# Patient Record
Sex: Male | Born: 1940 | Race: White | Hispanic: No | Smoking: Former smoker
Health system: Southern US, Community
[De-identification: ages and names within clinical notes are randomized; demographics above are authoritative.]

## PROBLEM LIST (undated history)

## (undated) DIAGNOSIS — I2510 Atherosclerotic heart disease of native coronary artery without angina pectoris: Secondary | ICD-10-CM

## (undated) DIAGNOSIS — E559 Vitamin D deficiency, unspecified: Secondary | ICD-10-CM

## (undated) DIAGNOSIS — F419 Anxiety disorder, unspecified: Secondary | ICD-10-CM

## (undated) DIAGNOSIS — K8020 Calculus of gallbladder without cholecystitis without obstruction: Secondary | ICD-10-CM

## (undated) DIAGNOSIS — D649 Anemia, unspecified: Secondary | ICD-10-CM

## (undated) DIAGNOSIS — G40909 Epilepsy, unspecified, not intractable, without status epilepticus: Secondary | ICD-10-CM

## (undated) DIAGNOSIS — G459 Transient cerebral ischemic attack, unspecified: Secondary | ICD-10-CM

## (undated) DIAGNOSIS — E538 Deficiency of other specified B group vitamins: Secondary | ICD-10-CM

## (undated) DIAGNOSIS — M5090 Cervical disc disorder, unspecified, unspecified cervical region: Secondary | ICD-10-CM

## (undated) DIAGNOSIS — N183 Chronic kidney disease, stage 3 unspecified: Secondary | ICD-10-CM

## (undated) DIAGNOSIS — E039 Hypothyroidism, unspecified: Secondary | ICD-10-CM

## (undated) DIAGNOSIS — I10 Essential (primary) hypertension: Secondary | ICD-10-CM

## (undated) DIAGNOSIS — N400 Enlarged prostate without lower urinary tract symptoms: Secondary | ICD-10-CM

## (undated) HISTORY — DX: Benign prostatic hyperplasia without lower urinary tract symptoms: N40.0

## (undated) HISTORY — DX: Epilepsy, unspecified, not intractable, without status epilepticus: G40.909

## (undated) HISTORY — PX: CHOLECYSTECTOMY: SHX55

## (undated) HISTORY — DX: Calculus of gallbladder without cholecystitis without obstruction: K80.20

## (undated) HISTORY — PX: CYSTOSCOPY WITH INSERTION OF UROLIFT: SHX6678

## (undated) HISTORY — DX: Deficiency of other specified B group vitamins: E53.8

## (undated) HISTORY — PX: BLADDER SURGERY: SHX569

## (undated) HISTORY — DX: Chronic kidney disease, stage 3 unspecified: N18.30

## (undated) HISTORY — PX: CATARACT EXTRACTION: SUR2

## (undated) HISTORY — DX: Atherosclerotic heart disease of native coronary artery without angina pectoris: I25.10

## (undated) HISTORY — DX: Essential (primary) hypertension: I10

## (undated) HISTORY — DX: Hypothyroidism, unspecified: E03.9

## (undated) HISTORY — DX: Vitamin D deficiency, unspecified: E55.9

## (undated) HISTORY — PX: NECK SURGERY: SHX720

## (undated) HISTORY — PX: OTHER SURGICAL HISTORY: SHX169

## (undated) HISTORY — DX: Transient cerebral ischemic attack, unspecified: G45.9

## (undated) HISTORY — PX: HERNIA REPAIR: SHX51

## (undated) HISTORY — DX: Anxiety disorder, unspecified: F41.9

## (undated) HISTORY — DX: Anemia, unspecified: D64.9

## (undated) HISTORY — DX: Cervical disc disorder, unspecified, unspecified cervical region: M50.90

## (undated) HISTORY — PX: TRANSURETHRAL RESECTION OF PROSTATE: SHX73

---

## 2007-08-23 ENCOUNTER — Inpatient Hospital Stay (HOSPITAL_COMMUNITY): Admission: RE | Admit: 2007-08-23 | Discharge: 2007-08-26 | Payer: Self-pay | Admitting: Neurosurgery

## 2010-07-19 NOTE — Op Note (Signed)
[name], [name]             ACCOUNT NO.:  [id number]   MEDICAL RECORD NO.:  [id number]          PATIENT TYPE:  INP   LOCATION:  3102                         FACILITY:  [facility]   PHYSICIAN:  Danae Orleans. Venetia Maxon, M.D.  DATE OF BIRTH:  1940-06-15   DATE OF PROCEDURE:  08/23/2007  DATE OF DISCHARGE:                               OPERATIVE REPORT   PREOPERATIVE DIAGNOSES:  Herniated cervical disk with spondylosis,  myelopathy, cervical stenosis, cervical radiculopathy at C3-4, C4-5, C5-  6, and C6-7 levels.   POSTOPERATIVE DIAGNOSES:  Herniated cervical disk with spondylosis,  myelopathy, cervical stenosis, cervical radiculopathy at C3-4, C4-5, C5-  6, and C6-7 levels.   PROCEDURES:  Anterior cervical decompression and fusion at C3-4, C4-5,  C5-6, and C6-7 levels with allograft bone wedge, morselized bone  autograft, FortrOss and anterior cervical plate.   SURGEON:  Danae Orleans. Venetia Maxon, MD   ASSISTANTS:  1. Georgiann Cocker, RN.  2. Stefani Dama, MD.   ANESTHESIA:  General endotracheal anesthesia.   ESTIMATED BLOOD LOSS:  100 mL.   COMPLICATIONS:  None.   DISPOSITION:  Recovery.   INDICATIONS:  Joshua Parker is a 70 year old man with severe cervical  spondylotic myelopathy, cord compression, and increased cord signal with  bilateral upper and lower extremity numbness and weakness, and cervical  myelopathy along with cervical radiculopathy.  It was elected to take  him to surgery for anterior cervical decompression and fusion at C3  through C7 levels.   PROCEDURE IN DETAIL:  Joshua Parker was brought to the operating room.  Following satisfactory and uncomplicated induction of general  endotracheal anesthesia and placement of intravenous lines, the patient  was placed in supine position on the operating table.  The neck was  placed in slight extension.  His neck was maintained in neutral  alignment.  His anterior neck was then prepped and draped in the usual  sterile fashion.   Area of planned incision was infiltrated with 0.25%  Marcaine, 0.5% lidocaine with 1:100,000 epinephrine.  Incision was made  in the midline to the anterior border of sternocleidomastoid muscle  carried sharply to the platysma layer.  Subplatysmal dissection was  performed exposing the anterior border of the sternocleidomastoid muscle  using blunt dissection.  The carotid sheath was kept lateral and the  trachea and esophagus was kept medial exposing the anterior cervical  spine.  A bent spinal needle was placed.  It was placed in one of the  anterior disk space and this was confirmed to be C5-C6 levels.  Subsequently, exposure was performed to C3 through C7 levels using the  electrocautery, key elevator, and mobilizing the longus colli muscles.  A self-retaining Shadow-Line retractor was placed to facilitate exposure  initially using the C3-C4 and C4-5 levels.  The operative microscope was  brought in field.  Distraction pins were placed at C3 and C4 and disk  spaces thoroughly cleared of disk material.  The high-speed drill was  used and suction trap was used to preserve the bone removed with  drilling of the endplate.  The uncinate spurs were drilled down with a  smaller  match stick bur and again bone autograft was saved for latter  use with bone grafting.  Using the 1, 2, and 3-mm gold-tipped Kerrison  rongeurs, the spinal cord dura was decompression as were both neural  foramina.  A 6-mm interbody graft was selected and packed with  morselized fashion with a high-speed drill pack, morselized and  autograft inserted in the interspace and countersunk appropriately.  Attention was then turned to the C4-5 level.  A similar decompression  was performed.  Again, the C5 nerve roots are decompressed at the  central spinal cord.  A large spondylotic bar was removed.  These have  all been decompressed on the spinal cord dura.  After trial sizing it  similarly, size graft was selected and the  fashion with high-speed drill  and packed with morselized bone autograft.  FortrOss inserted in the  interspace and countersunk appropriately and C6-7 level was then  addressed, and at this level, the endplates were stripped off disk  material.  Uncinate spurs were drilled down.  A large central disk  herniation was removed with significant decompression of the central  spinal cord dura, after trial sizing, an 8-mm graft was selected,  fashioned with high-speed drill, packed with morcellized bone autograft  and FortrOss inserted in the interspace and countersunk appropriately.  Then C5-C6 level decompression was again performed and 6-mm graft was  inserted.  A 72-mm Trestle anterior cervical plate was then affixed to  the anterior cervical spine using variable angle 14-mm screws two at C3,  two at C4, two at C5, two at C6, and two at C7.  All screws had  excellent purchase.  Positioning was confirmed on lateral x-ray.  The  plate had been bent.  The inner lordosis of the plate was increased  somewhat with plate bent or prior to placing the plate.  Hemostasis was  then reassured.  Soft tissues were inspected and found to be in good  repair.  A #7 JP drain was inserted through separate stab incision and  anchored with a single nylon stitch.  The platysmal layer was closed  with 3-0 Vicryl sutures.  Skin edges were reapproximated with 3-0 Vicryl  sutures, and the wound was dressed with benzoin, Steri-Strips, Telfa  gauze, and tape.  The patient was extubated in the operating room and  taken to the recovery in stable and satisfactory condition having  tolerated the operation well.  Counts were correct at the end of the  case.      Danae Orleans. Venetia Maxon, M.D.  Electronically Signed     JDS/MEDQ  D:  08/23/2007  T:  08/24/2007  Job:  [id number]

## 2010-07-22 NOTE — Discharge Summary (Signed)
Joshua Parker, Joshua Parker             ACCOUNT NO.:  [id number]   MEDICAL RECORD NO.:  [id number]          PATIENT TYPE:  INP   LOCATION:  3102                         FACILITY:  [facility]   PHYSICIAN:  Danae Orleans. Venetia Maxon, M.D.  DATE OF BIRTH:  Jan 28, 1941   DATE OF ADMISSION:  08/23/2007  DATE OF DISCHARGE:  08/26/2007                               DISCHARGE SUMMARY   REASON FOR ADMISSION:  1. Cervical spondylosis with myelopathy.  2. Cervical disk herniation with myelopathy.  3. Hypothyroidism.  4. History of tobacco use.  5. Epilepsy, unspecified.   FINAL DIAGNOSES:  1. Cervical spondylosis with myelopathy.  2. Cervical disk herniation with myelopathy.  3. Hypothyroidism.  4. History of tobacco use.  5. Epilepsy, unspecified.   HISTORY OF ILLNESS AND HOSPITAL COURSE:  Joshua Parker is a 70-year-  old man with herniated cervical disk with cervical stenosis, cervical  radiculopathy, and myelopathy with over 4 levels.  He was admitted to  the hospital and underwent uncomplicated anterior cervical decompression  and fusion, C3-C7 levels.  Postoperatively, he did have some confusion  which gradually resolved.  He was able to walk with a walker.  He was  observed in the hospital.  He had a drain which was discontinued on August 25, 2007 and was doing well on the August 26, 2007.  He was discharged  home in stable and satisfactory condition having tolerated his operation  and hospitalization well.   DISCHARGE MEDICATIONS:  1. Percocet.  2. Flexeril.   He was instructed to follow up in the office in 3 weeks postoperatively.  He was discharged home with a collar.      Danae Orleans. Venetia Maxon, M.D.  Electronically Signed     JDS/MEDQ  D:  10/16/2007  T:  10/16/2007  Job:  [id number]

## 2010-12-01 LAB — BASIC METABOLIC PANEL
CO2: 27
Chloride: 106
GFR calc non Af Amer: >60
Glucose, Bld: 108 — ABNORMAL HIGH
Potassium: 3.9
Sodium: 139

## 2010-12-01 LAB — CBC
HCT: 36.6 — ABNORMAL LOW
Hemoglobin: 12.7 — ABNORMAL LOW
MCHC: 34.6
MCV: 88.9
RDW: 13.1

## 2011-05-20 DIAGNOSIS — R569 Unspecified convulsions: Secondary | ICD-10-CM | POA: Diagnosis not present

## 2011-05-20 DIAGNOSIS — R32 Unspecified urinary incontinence: Secondary | ICD-10-CM | POA: Diagnosis not present

## 2011-05-20 DIAGNOSIS — I6350 Cerebral infarction due to unspecified occlusion or stenosis of unspecified cerebral artery: Secondary | ICD-10-CM | POA: Diagnosis not present

## 2011-05-20 DIAGNOSIS — R4182 Altered mental status, unspecified: Secondary | ICD-10-CM | POA: Diagnosis not present

## 2011-05-20 DIAGNOSIS — R404 Transient alteration of awareness: Secondary | ICD-10-CM | POA: Diagnosis not present

## 2011-05-20 DIAGNOSIS — E039 Hypothyroidism, unspecified: Secondary | ICD-10-CM | POA: Diagnosis not present

## 2011-05-20 DIAGNOSIS — G9389 Other specified disorders of brain: Secondary | ICD-10-CM | POA: Diagnosis not present

## 2011-05-20 DIAGNOSIS — G40909 Epilepsy, unspecified, not intractable, without status epilepticus: Secondary | ICD-10-CM | POA: Diagnosis not present

## 2011-05-20 DIAGNOSIS — R51 Headache: Secondary | ICD-10-CM | POA: Diagnosis not present

## 2011-05-20 DIAGNOSIS — Z79899 Other long term (current) drug therapy: Secondary | ICD-10-CM | POA: Diagnosis not present

## 2011-05-20 DIAGNOSIS — G40802 Other epilepsy, not intractable, without status epilepticus: Secondary | ICD-10-CM | POA: Diagnosis not present

## 2011-05-20 DIAGNOSIS — Z87820 Personal history of traumatic brain injury: Secondary | ICD-10-CM | POA: Diagnosis not present

## 2011-05-21 DIAGNOSIS — E039 Hypothyroidism, unspecified: Secondary | ICD-10-CM | POA: Diagnosis not present

## 2011-05-21 DIAGNOSIS — R569 Unspecified convulsions: Secondary | ICD-10-CM | POA: Diagnosis not present

## 2011-05-30 DIAGNOSIS — Z79899 Other long term (current) drug therapy: Secondary | ICD-10-CM | POA: Diagnosis not present

## 2011-05-30 DIAGNOSIS — F329 Major depressive disorder, single episode, unspecified: Secondary | ICD-10-CM | POA: Diagnosis not present

## 2011-05-30 DIAGNOSIS — G40802 Other epilepsy, not intractable, without status epilepticus: Secondary | ICD-10-CM | POA: Diagnosis not present

## 2011-05-30 DIAGNOSIS — F3289 Other specified depressive episodes: Secondary | ICD-10-CM | POA: Diagnosis not present

## 2011-05-30 DIAGNOSIS — E039 Hypothyroidism, unspecified: Secondary | ICD-10-CM | POA: Diagnosis not present

## 2011-06-12 DIAGNOSIS — G43009 Migraine without aura, not intractable, without status migrainosus: Secondary | ICD-10-CM | POA: Diagnosis not present

## 2011-06-12 DIAGNOSIS — G40909 Epilepsy, unspecified, not intractable, without status epilepticus: Secondary | ICD-10-CM | POA: Diagnosis not present

## 2011-06-12 DIAGNOSIS — R413 Other amnesia: Secondary | ICD-10-CM | POA: Diagnosis not present

## 2011-06-27 DIAGNOSIS — Z79899 Other long term (current) drug therapy: Secondary | ICD-10-CM | POA: Diagnosis not present

## 2011-06-27 DIAGNOSIS — G40802 Other epilepsy, not intractable, without status epilepticus: Secondary | ICD-10-CM | POA: Diagnosis not present

## 2011-06-27 DIAGNOSIS — E039 Hypothyroidism, unspecified: Secondary | ICD-10-CM | POA: Diagnosis not present

## 2011-07-24 DIAGNOSIS — Z79899 Other long term (current) drug therapy: Secondary | ICD-10-CM | POA: Diagnosis not present

## 2011-07-24 DIAGNOSIS — E039 Hypothyroidism, unspecified: Secondary | ICD-10-CM | POA: Diagnosis not present

## 2011-08-28 DIAGNOSIS — B372 Candidiasis of skin and nail: Secondary | ICD-10-CM | POA: Diagnosis not present

## 2011-09-26 DIAGNOSIS — M62838 Other muscle spasm: Secondary | ICD-10-CM | POA: Diagnosis not present

## 2011-09-26 DIAGNOSIS — E538 Deficiency of other specified B group vitamins: Secondary | ICD-10-CM | POA: Diagnosis not present

## 2011-09-26 DIAGNOSIS — Z79899 Other long term (current) drug therapy: Secondary | ICD-10-CM | POA: Diagnosis not present

## 2011-09-26 DIAGNOSIS — R21 Rash and other nonspecific skin eruption: Secondary | ICD-10-CM | POA: Diagnosis not present

## 2011-09-26 DIAGNOSIS — E039 Hypothyroidism, unspecified: Secondary | ICD-10-CM | POA: Diagnosis not present

## 2011-09-26 DIAGNOSIS — G40802 Other epilepsy, not intractable, without status epilepticus: Secondary | ICD-10-CM | POA: Diagnosis not present

## 2011-09-26 DIAGNOSIS — E559 Vitamin D deficiency, unspecified: Secondary | ICD-10-CM | POA: Diagnosis not present

## 2011-10-17 DIAGNOSIS — Z79899 Other long term (current) drug therapy: Secondary | ICD-10-CM | POA: Diagnosis not present

## 2011-10-31 DIAGNOSIS — Z79899 Other long term (current) drug therapy: Secondary | ICD-10-CM | POA: Diagnosis not present

## 2011-11-22 DIAGNOSIS — E039 Hypothyroidism, unspecified: Secondary | ICD-10-CM | POA: Diagnosis not present

## 2011-11-22 DIAGNOSIS — Z79899 Other long term (current) drug therapy: Secondary | ICD-10-CM | POA: Diagnosis not present

## 2011-12-27 DIAGNOSIS — E559 Vitamin D deficiency, unspecified: Secondary | ICD-10-CM | POA: Diagnosis not present

## 2011-12-27 DIAGNOSIS — G40802 Other epilepsy, not intractable, without status epilepticus: Secondary | ICD-10-CM | POA: Diagnosis not present

## 2011-12-27 DIAGNOSIS — E039 Hypothyroidism, unspecified: Secondary | ICD-10-CM | POA: Diagnosis not present

## 2011-12-27 DIAGNOSIS — E538 Deficiency of other specified B group vitamins: Secondary | ICD-10-CM | POA: Diagnosis not present

## 2011-12-27 DIAGNOSIS — Z79899 Other long term (current) drug therapy: Secondary | ICD-10-CM | POA: Diagnosis not present

## 2011-12-27 DIAGNOSIS — D539 Nutritional anemia, unspecified: Secondary | ICD-10-CM | POA: Diagnosis not present

## 2011-12-27 DIAGNOSIS — M5030 Other cervical disc degeneration, unspecified cervical region: Secondary | ICD-10-CM | POA: Diagnosis not present

## 2012-02-05 DIAGNOSIS — Z79899 Other long term (current) drug therapy: Secondary | ICD-10-CM | POA: Diagnosis not present

## 2012-03-27 DIAGNOSIS — M539 Dorsopathy, unspecified: Secondary | ICD-10-CM | POA: Diagnosis not present

## 2012-03-27 DIAGNOSIS — Z79899 Other long term (current) drug therapy: Secondary | ICD-10-CM | POA: Diagnosis not present

## 2012-03-27 DIAGNOSIS — D649 Anemia, unspecified: Secondary | ICD-10-CM | POA: Diagnosis not present

## 2012-03-27 DIAGNOSIS — E559 Vitamin D deficiency, unspecified: Secondary | ICD-10-CM | POA: Diagnosis not present

## 2012-03-27 DIAGNOSIS — M999 Biomechanical lesion, unspecified: Secondary | ICD-10-CM | POA: Diagnosis not present

## 2012-03-27 DIAGNOSIS — G40802 Other epilepsy, not intractable, without status epilepticus: Secondary | ICD-10-CM | POA: Diagnosis not present

## 2012-03-27 DIAGNOSIS — M25659 Stiffness of unspecified hip, not elsewhere classified: Secondary | ICD-10-CM | POA: Diagnosis not present

## 2012-03-27 DIAGNOSIS — E039 Hypothyroidism, unspecified: Secondary | ICD-10-CM | POA: Diagnosis not present

## 2012-03-27 DIAGNOSIS — M5030 Other cervical disc degeneration, unspecified cervical region: Secondary | ICD-10-CM | POA: Diagnosis not present

## 2012-03-27 DIAGNOSIS — M5380 Other specified dorsopathies, site unspecified: Secondary | ICD-10-CM | POA: Diagnosis not present

## 2012-03-27 DIAGNOSIS — R7989 Other specified abnormal findings of blood chemistry: Secondary | ICD-10-CM | POA: Diagnosis not present

## 2012-03-27 DIAGNOSIS — R51 Headache: Secondary | ICD-10-CM | POA: Diagnosis not present

## 2012-03-29 DIAGNOSIS — M5380 Other specified dorsopathies, site unspecified: Secondary | ICD-10-CM | POA: Diagnosis not present

## 2012-03-29 DIAGNOSIS — M25659 Stiffness of unspecified hip, not elsewhere classified: Secondary | ICD-10-CM | POA: Diagnosis not present

## 2012-03-29 DIAGNOSIS — M999 Biomechanical lesion, unspecified: Secondary | ICD-10-CM | POA: Diagnosis not present

## 2012-04-01 DIAGNOSIS — M5380 Other specified dorsopathies, site unspecified: Secondary | ICD-10-CM | POA: Diagnosis not present

## 2012-04-01 DIAGNOSIS — M999 Biomechanical lesion, unspecified: Secondary | ICD-10-CM | POA: Diagnosis not present

## 2012-04-01 DIAGNOSIS — M25659 Stiffness of unspecified hip, not elsewhere classified: Secondary | ICD-10-CM | POA: Diagnosis not present

## 2012-05-22 DIAGNOSIS — R51 Headache: Secondary | ICD-10-CM | POA: Diagnosis not present

## 2012-07-30 DIAGNOSIS — Z79899 Other long term (current) drug therapy: Secondary | ICD-10-CM | POA: Diagnosis not present

## 2012-07-30 DIAGNOSIS — R7989 Other specified abnormal findings of blood chemistry: Secondary | ICD-10-CM | POA: Diagnosis not present

## 2012-07-30 DIAGNOSIS — E039 Hypothyroidism, unspecified: Secondary | ICD-10-CM | POA: Diagnosis not present

## 2012-08-13 DIAGNOSIS — G40802 Other epilepsy, not intractable, without status epilepticus: Secondary | ICD-10-CM | POA: Diagnosis not present

## 2012-08-13 DIAGNOSIS — R945 Abnormal results of liver function studies: Secondary | ICD-10-CM | POA: Diagnosis not present

## 2012-08-13 DIAGNOSIS — R51 Headache: Secondary | ICD-10-CM | POA: Diagnosis not present

## 2012-08-13 DIAGNOSIS — E039 Hypothyroidism, unspecified: Secondary | ICD-10-CM | POA: Diagnosis not present

## 2012-09-04 DIAGNOSIS — R945 Abnormal results of liver function studies: Secondary | ICD-10-CM | POA: Diagnosis not present

## 2012-09-04 DIAGNOSIS — K8020 Calculus of gallbladder without cholecystitis without obstruction: Secondary | ICD-10-CM | POA: Diagnosis not present

## 2012-09-26 DIAGNOSIS — Z79899 Other long term (current) drug therapy: Secondary | ICD-10-CM | POA: Diagnosis not present

## 2012-09-26 DIAGNOSIS — E039 Hypothyroidism, unspecified: Secondary | ICD-10-CM | POA: Diagnosis not present

## 2012-10-30 DIAGNOSIS — F411 Generalized anxiety disorder: Secondary | ICD-10-CM | POA: Diagnosis not present

## 2012-10-30 DIAGNOSIS — G40802 Other epilepsy, not intractable, without status epilepticus: Secondary | ICD-10-CM | POA: Diagnosis not present

## 2012-10-30 DIAGNOSIS — Z79899 Other long term (current) drug therapy: Secondary | ICD-10-CM | POA: Diagnosis not present

## 2012-10-30 DIAGNOSIS — R51 Headache: Secondary | ICD-10-CM | POA: Diagnosis not present

## 2012-12-05 DIAGNOSIS — R269 Unspecified abnormalities of gait and mobility: Secondary | ICD-10-CM | POA: Diagnosis not present

## 2012-12-05 DIAGNOSIS — E538 Deficiency of other specified B group vitamins: Secondary | ICD-10-CM | POA: Diagnosis not present

## 2012-12-05 DIAGNOSIS — E559 Vitamin D deficiency, unspecified: Secondary | ICD-10-CM | POA: Diagnosis not present

## 2012-12-05 DIAGNOSIS — Z79899 Other long term (current) drug therapy: Secondary | ICD-10-CM | POA: Diagnosis not present

## 2012-12-05 DIAGNOSIS — M5030 Other cervical disc degeneration, unspecified cervical region: Secondary | ICD-10-CM | POA: Diagnosis not present

## 2012-12-05 DIAGNOSIS — E039 Hypothyroidism, unspecified: Secondary | ICD-10-CM | POA: Diagnosis not present

## 2012-12-05 DIAGNOSIS — R51 Headache: Secondary | ICD-10-CM | POA: Diagnosis not present

## 2012-12-14 DIAGNOSIS — H353190 Nonexudative age-related macular degeneration, unspecified eye, stage unspecified: Secondary | ICD-10-CM | POA: Diagnosis not present

## 2012-12-14 DIAGNOSIS — H2510 Age-related nuclear cataract, unspecified eye: Secondary | ICD-10-CM | POA: Diagnosis not present

## 2013-01-20 DIAGNOSIS — R21 Rash and other nonspecific skin eruption: Secondary | ICD-10-CM | POA: Diagnosis not present

## 2013-01-20 DIAGNOSIS — G40802 Other epilepsy, not intractable, without status epilepticus: Secondary | ICD-10-CM | POA: Diagnosis not present

## 2013-01-20 DIAGNOSIS — R51 Headache: Secondary | ICD-10-CM | POA: Diagnosis not present

## 2013-02-20 DIAGNOSIS — E039 Hypothyroidism, unspecified: Secondary | ICD-10-CM | POA: Diagnosis not present

## 2013-02-20 DIAGNOSIS — Z79899 Other long term (current) drug therapy: Secondary | ICD-10-CM | POA: Diagnosis not present

## 2013-02-21 DIAGNOSIS — H2510 Age-related nuclear cataract, unspecified eye: Secondary | ICD-10-CM | POA: Diagnosis not present

## 2013-02-21 DIAGNOSIS — H353190 Nonexudative age-related macular degeneration, unspecified eye, stage unspecified: Secondary | ICD-10-CM | POA: Diagnosis not present

## 2013-04-15 DIAGNOSIS — E559 Vitamin D deficiency, unspecified: Secondary | ICD-10-CM | POA: Diagnosis not present

## 2013-04-15 DIAGNOSIS — E039 Hypothyroidism, unspecified: Secondary | ICD-10-CM | POA: Diagnosis not present

## 2013-04-15 DIAGNOSIS — Z79899 Other long term (current) drug therapy: Secondary | ICD-10-CM | POA: Diagnosis not present

## 2013-04-15 DIAGNOSIS — G40802 Other epilepsy, not intractable, without status epilepticus: Secondary | ICD-10-CM | POA: Diagnosis not present

## 2013-04-15 DIAGNOSIS — R51 Headache: Secondary | ICD-10-CM | POA: Diagnosis not present

## 2013-07-10 DIAGNOSIS — Z79899 Other long term (current) drug therapy: Secondary | ICD-10-CM | POA: Diagnosis not present

## 2013-07-10 DIAGNOSIS — G40802 Other epilepsy, not intractable, without status epilepticus: Secondary | ICD-10-CM | POA: Diagnosis not present

## 2013-07-10 DIAGNOSIS — R21 Rash and other nonspecific skin eruption: Secondary | ICD-10-CM | POA: Diagnosis not present

## 2013-07-10 DIAGNOSIS — E039 Hypothyroidism, unspecified: Secondary | ICD-10-CM | POA: Diagnosis not present

## 2013-08-18 DIAGNOSIS — E039 Hypothyroidism, unspecified: Secondary | ICD-10-CM | POA: Diagnosis not present

## 2013-08-18 DIAGNOSIS — G40802 Other epilepsy, not intractable, without status epilepticus: Secondary | ICD-10-CM | POA: Diagnosis not present

## 2013-08-18 DIAGNOSIS — T50995A Adverse effect of other drugs, medicaments and biological substances, initial encounter: Secondary | ICD-10-CM | POA: Diagnosis not present

## 2013-08-18 DIAGNOSIS — Z79899 Other long term (current) drug therapy: Secondary | ICD-10-CM | POA: Diagnosis not present

## 2013-08-18 DIAGNOSIS — M5030 Other cervical disc degeneration, unspecified cervical region: Secondary | ICD-10-CM | POA: Diagnosis not present

## 2013-11-18 DIAGNOSIS — E039 Hypothyroidism, unspecified: Secondary | ICD-10-CM | POA: Diagnosis not present

## 2013-11-18 DIAGNOSIS — H6980 Other specified disorders of Eustachian tube, unspecified ear: Secondary | ICD-10-CM | POA: Diagnosis not present

## 2013-11-18 DIAGNOSIS — G40802 Other epilepsy, not intractable, without status epilepticus: Secondary | ICD-10-CM | POA: Diagnosis not present

## 2013-11-18 DIAGNOSIS — Z79899 Other long term (current) drug therapy: Secondary | ICD-10-CM | POA: Diagnosis not present

## 2013-11-18 DIAGNOSIS — E559 Vitamin D deficiency, unspecified: Secondary | ICD-10-CM | POA: Diagnosis not present

## 2013-11-18 DIAGNOSIS — R51 Headache: Secondary | ICD-10-CM | POA: Diagnosis not present

## 2013-11-18 DIAGNOSIS — R21 Rash and other nonspecific skin eruption: Secondary | ICD-10-CM | POA: Diagnosis not present

## 2013-11-18 DIAGNOSIS — D649 Anemia, unspecified: Secondary | ICD-10-CM | POA: Diagnosis not present

## 2013-11-18 DIAGNOSIS — R945 Abnormal results of liver function studies: Secondary | ICD-10-CM | POA: Diagnosis not present

## 2013-11-27 DIAGNOSIS — L820 Inflamed seborrheic keratosis: Secondary | ICD-10-CM | POA: Diagnosis not present

## 2013-11-27 DIAGNOSIS — B356 Tinea cruris: Secondary | ICD-10-CM | POA: Diagnosis not present

## 2013-11-27 DIAGNOSIS — L723 Sebaceous cyst: Secondary | ICD-10-CM | POA: Diagnosis not present

## 2013-12-04 DIAGNOSIS — L720 Epidermal cyst: Secondary | ICD-10-CM | POA: Diagnosis not present

## 2014-02-17 DIAGNOSIS — E039 Hypothyroidism, unspecified: Secondary | ICD-10-CM | POA: Diagnosis not present

## 2014-02-17 DIAGNOSIS — D649 Anemia, unspecified: Secondary | ICD-10-CM | POA: Diagnosis not present

## 2014-02-17 DIAGNOSIS — E538 Deficiency of other specified B group vitamins: Secondary | ICD-10-CM | POA: Diagnosis not present

## 2014-02-17 DIAGNOSIS — R51 Headache: Secondary | ICD-10-CM | POA: Diagnosis not present

## 2014-02-17 DIAGNOSIS — G4089 Other seizures: Secondary | ICD-10-CM | POA: Diagnosis not present

## 2014-02-17 DIAGNOSIS — R21 Rash and other nonspecific skin eruption: Secondary | ICD-10-CM | POA: Diagnosis not present

## 2014-02-17 DIAGNOSIS — Z79899 Other long term (current) drug therapy: Secondary | ICD-10-CM | POA: Diagnosis not present

## 2014-02-17 DIAGNOSIS — E559 Vitamin D deficiency, unspecified: Secondary | ICD-10-CM | POA: Diagnosis not present

## 2014-04-13 DIAGNOSIS — E039 Hypothyroidism, unspecified: Secondary | ICD-10-CM | POA: Diagnosis not present

## 2014-04-17 DIAGNOSIS — G9389 Other specified disorders of brain: Secondary | ICD-10-CM | POA: Diagnosis not present

## 2014-04-17 DIAGNOSIS — R51 Headache: Secondary | ICD-10-CM | POA: Diagnosis not present

## 2014-04-17 DIAGNOSIS — G40509 Epileptic seizures related to external causes, not intractable, without status epilepticus: Secondary | ICD-10-CM | POA: Diagnosis not present

## 2014-04-17 DIAGNOSIS — M5030 Other cervical disc degeneration, unspecified cervical region: Secondary | ICD-10-CM | POA: Diagnosis not present

## 2014-05-19 DIAGNOSIS — R51 Headache: Secondary | ICD-10-CM | POA: Diagnosis not present

## 2014-05-19 DIAGNOSIS — Z79899 Other long term (current) drug therapy: Secondary | ICD-10-CM | POA: Diagnosis not present

## 2014-05-19 DIAGNOSIS — G40509 Epileptic seizures related to external causes, not intractable, without status epilepticus: Secondary | ICD-10-CM | POA: Diagnosis not present

## 2014-05-19 DIAGNOSIS — E039 Hypothyroidism, unspecified: Secondary | ICD-10-CM | POA: Diagnosis not present

## 2014-05-19 DIAGNOSIS — E559 Vitamin D deficiency, unspecified: Secondary | ICD-10-CM | POA: Diagnosis not present

## 2014-05-19 DIAGNOSIS — M5030 Other cervical disc degeneration, unspecified cervical region: Secondary | ICD-10-CM | POA: Diagnosis not present

## 2014-06-25 DIAGNOSIS — Z79899 Other long term (current) drug therapy: Secondary | ICD-10-CM | POA: Diagnosis not present

## 2014-07-28 DIAGNOSIS — Z79899 Other long term (current) drug therapy: Secondary | ICD-10-CM | POA: Diagnosis not present

## 2014-08-21 DIAGNOSIS — E559 Vitamin D deficiency, unspecified: Secondary | ICD-10-CM | POA: Diagnosis not present

## 2014-08-21 DIAGNOSIS — Z79899 Other long term (current) drug therapy: Secondary | ICD-10-CM | POA: Diagnosis not present

## 2014-08-21 DIAGNOSIS — E039 Hypothyroidism, unspecified: Secondary | ICD-10-CM | POA: Diagnosis not present

## 2014-09-04 DIAGNOSIS — G9340 Encephalopathy, unspecified: Secondary | ICD-10-CM | POA: Diagnosis not present

## 2014-09-04 DIAGNOSIS — G9389 Other specified disorders of brain: Secondary | ICD-10-CM | POA: Diagnosis not present

## 2014-09-04 DIAGNOSIS — F0390 Unspecified dementia without behavioral disturbance: Secondary | ICD-10-CM | POA: Diagnosis not present

## 2014-09-04 DIAGNOSIS — G40909 Epilepsy, unspecified, not intractable, without status epilepticus: Secondary | ICD-10-CM | POA: Diagnosis not present

## 2014-09-04 DIAGNOSIS — R509 Fever, unspecified: Secondary | ICD-10-CM | POA: Diagnosis not present

## 2014-09-04 DIAGNOSIS — G319 Degenerative disease of nervous system, unspecified: Secondary | ICD-10-CM | POA: Diagnosis not present

## 2014-09-04 DIAGNOSIS — R569 Unspecified convulsions: Secondary | ICD-10-CM | POA: Diagnosis not present

## 2014-09-04 DIAGNOSIS — I517 Cardiomegaly: Secondary | ICD-10-CM | POA: Diagnosis not present

## 2014-09-04 DIAGNOSIS — R410 Disorientation, unspecified: Secondary | ICD-10-CM | POA: Diagnosis not present

## 2014-09-04 DIAGNOSIS — R9082 White matter disease, unspecified: Secondary | ICD-10-CM | POA: Diagnosis not present

## 2014-09-05 DIAGNOSIS — R509 Fever, unspecified: Secondary | ICD-10-CM | POA: Diagnosis not present

## 2014-09-05 DIAGNOSIS — G9389 Other specified disorders of brain: Secondary | ICD-10-CM | POA: Diagnosis not present

## 2014-09-05 DIAGNOSIS — S0990XA Unspecified injury of head, initial encounter: Secondary | ICD-10-CM | POA: Diagnosis not present

## 2014-09-05 DIAGNOSIS — D72829 Elevated white blood cell count, unspecified: Secondary | ICD-10-CM | POA: Diagnosis not present

## 2014-09-05 DIAGNOSIS — G9341 Metabolic encephalopathy: Secondary | ICD-10-CM | POA: Diagnosis not present

## 2014-09-05 DIAGNOSIS — R569 Unspecified convulsions: Secondary | ICD-10-CM | POA: Diagnosis not present

## 2014-09-06 DIAGNOSIS — I517 Cardiomegaly: Secondary | ICD-10-CM | POA: Diagnosis present

## 2014-09-06 DIAGNOSIS — I214 Non-ST elevation (NSTEMI) myocardial infarction: Secondary | ICD-10-CM | POA: Diagnosis not present

## 2014-09-06 DIAGNOSIS — F419 Anxiety disorder, unspecified: Secondary | ICD-10-CM | POA: Diagnosis present

## 2014-09-06 DIAGNOSIS — E039 Hypothyroidism, unspecified: Secondary | ICD-10-CM | POA: Diagnosis present

## 2014-09-06 DIAGNOSIS — R509 Fever, unspecified: Secondary | ICD-10-CM | POA: Diagnosis not present

## 2014-09-06 DIAGNOSIS — I639 Cerebral infarction, unspecified: Secondary | ICD-10-CM | POA: Diagnosis not present

## 2014-09-06 DIAGNOSIS — Z888 Allergy status to other drugs, medicaments and biological substances status: Secondary | ICD-10-CM | POA: Diagnosis not present

## 2014-09-06 DIAGNOSIS — G40909 Epilepsy, unspecified, not intractable, without status epilepticus: Secondary | ICD-10-CM | POA: Diagnosis not present

## 2014-09-06 DIAGNOSIS — G9340 Encephalopathy, unspecified: Secondary | ICD-10-CM | POA: Diagnosis not present

## 2014-09-06 DIAGNOSIS — I083 Combined rheumatic disorders of mitral, aortic and tricuspid valves: Secondary | ICD-10-CM | POA: Diagnosis not present

## 2014-09-06 DIAGNOSIS — R51 Headache: Secondary | ICD-10-CM | POA: Diagnosis not present

## 2014-09-06 DIAGNOSIS — R569 Unspecified convulsions: Secondary | ICD-10-CM | POA: Diagnosis not present

## 2014-09-06 DIAGNOSIS — I480 Paroxysmal atrial fibrillation: Secondary | ICD-10-CM | POA: Diagnosis not present

## 2014-09-06 DIAGNOSIS — F329 Major depressive disorder, single episode, unspecified: Secondary | ICD-10-CM | POA: Diagnosis present

## 2014-09-06 DIAGNOSIS — F0390 Unspecified dementia without behavioral disturbance: Secondary | ICD-10-CM | POA: Diagnosis present

## 2014-09-06 DIAGNOSIS — I638 Other cerebral infarction: Secondary | ICD-10-CM | POA: Diagnosis present

## 2014-09-06 DIAGNOSIS — B003 Herpesviral meningitis: Secondary | ICD-10-CM | POA: Diagnosis not present

## 2014-09-06 DIAGNOSIS — D72829 Elevated white blood cell count, unspecified: Secondary | ICD-10-CM | POA: Diagnosis present

## 2014-09-06 DIAGNOSIS — A879 Viral meningitis, unspecified: Secondary | ICD-10-CM | POA: Diagnosis not present

## 2014-09-06 DIAGNOSIS — G9341 Metabolic encephalopathy: Secondary | ICD-10-CM | POA: Diagnosis not present

## 2014-09-06 DIAGNOSIS — G039 Meningitis, unspecified: Secondary | ICD-10-CM | POA: Diagnosis not present

## 2014-09-06 DIAGNOSIS — R4182 Altered mental status, unspecified: Secondary | ICD-10-CM | POA: Diagnosis not present

## 2014-09-06 DIAGNOSIS — G9389 Other specified disorders of brain: Secondary | ICD-10-CM | POA: Diagnosis present

## 2014-09-06 DIAGNOSIS — I4891 Unspecified atrial fibrillation: Secondary | ICD-10-CM | POA: Diagnosis present

## 2014-09-06 DIAGNOSIS — I2510 Atherosclerotic heart disease of native coronary artery without angina pectoris: Secondary | ICD-10-CM | POA: Diagnosis not present

## 2014-09-11 DIAGNOSIS — G4700 Insomnia, unspecified: Secondary | ICD-10-CM | POA: Diagnosis not present

## 2014-09-11 DIAGNOSIS — G40909 Epilepsy, unspecified, not intractable, without status epilepticus: Secondary | ICD-10-CM | POA: Diagnosis not present

## 2014-09-11 DIAGNOSIS — Z87891 Personal history of nicotine dependence: Secondary | ICD-10-CM | POA: Diagnosis not present

## 2014-09-11 DIAGNOSIS — Z7901 Long term (current) use of anticoagulants: Secondary | ICD-10-CM | POA: Diagnosis not present

## 2014-09-11 DIAGNOSIS — E039 Hypothyroidism, unspecified: Secondary | ICD-10-CM | POA: Diagnosis not present

## 2014-09-11 DIAGNOSIS — K121 Other forms of stomatitis: Secondary | ICD-10-CM | POA: Diagnosis not present

## 2014-09-11 DIAGNOSIS — K5900 Constipation, unspecified: Secondary | ICD-10-CM | POA: Diagnosis not present

## 2014-09-11 DIAGNOSIS — R4587 Impulsiveness: Secondary | ICD-10-CM | POA: Diagnosis not present

## 2014-09-11 DIAGNOSIS — I69322 Dysarthria following cerebral infarction: Secondary | ICD-10-CM | POA: Diagnosis not present

## 2014-09-11 DIAGNOSIS — E876 Hypokalemia: Secondary | ICD-10-CM | POA: Diagnosis not present

## 2014-09-11 DIAGNOSIS — I471 Supraventricular tachycardia: Secondary | ICD-10-CM | POA: Diagnosis not present

## 2014-09-11 DIAGNOSIS — I69391 Dysphagia following cerebral infarction: Secondary | ICD-10-CM | POA: Diagnosis not present

## 2014-09-11 DIAGNOSIS — R262 Difficulty in walking, not elsewhere classified: Secondary | ICD-10-CM | POA: Diagnosis not present

## 2014-09-11 DIAGNOSIS — R1310 Dysphagia, unspecified: Secondary | ICD-10-CM | POA: Diagnosis not present

## 2014-09-11 DIAGNOSIS — Z87828 Personal history of other (healed) physical injury and trauma: Secondary | ICD-10-CM | POA: Diagnosis not present

## 2014-09-11 DIAGNOSIS — G43909 Migraine, unspecified, not intractable, without status migrainosus: Secondary | ICD-10-CM | POA: Diagnosis not present

## 2014-09-11 DIAGNOSIS — G8929 Other chronic pain: Secondary | ICD-10-CM | POA: Diagnosis not present

## 2014-09-11 DIAGNOSIS — I6930 Unspecified sequelae of cerebral infarction: Secondary | ICD-10-CM | POA: Diagnosis not present

## 2014-09-11 DIAGNOSIS — F329 Major depressive disorder, single episode, unspecified: Secondary | ICD-10-CM | POA: Diagnosis not present

## 2014-09-11 DIAGNOSIS — I6931 Cognitive deficits following cerebral infarction: Secondary | ICD-10-CM | POA: Diagnosis not present

## 2014-09-11 DIAGNOSIS — Z7982 Long term (current) use of aspirin: Secondary | ICD-10-CM | POA: Diagnosis not present

## 2014-09-11 DIAGNOSIS — R569 Unspecified convulsions: Secondary | ICD-10-CM | POA: Diagnosis not present

## 2014-09-11 DIAGNOSIS — Z4889 Encounter for other specified surgical aftercare: Secondary | ICD-10-CM | POA: Diagnosis not present

## 2014-09-11 DIAGNOSIS — R509 Fever, unspecified: Secondary | ICD-10-CM | POA: Diagnosis not present

## 2014-09-11 DIAGNOSIS — R001 Bradycardia, unspecified: Secondary | ICD-10-CM | POA: Diagnosis not present

## 2014-09-11 DIAGNOSIS — G9340 Encephalopathy, unspecified: Secondary | ICD-10-CM | POA: Diagnosis not present

## 2014-09-11 DIAGNOSIS — Z8673 Personal history of transient ischemic attack (TIA), and cerebral infarction without residual deficits: Secondary | ICD-10-CM | POA: Diagnosis not present

## 2014-09-11 DIAGNOSIS — I214 Non-ST elevation (NSTEMI) myocardial infarction: Secondary | ICD-10-CM | POA: Diagnosis not present

## 2014-10-07 DIAGNOSIS — I639 Cerebral infarction, unspecified: Secondary | ICD-10-CM | POA: Diagnosis not present

## 2014-10-07 DIAGNOSIS — R569 Unspecified convulsions: Secondary | ICD-10-CM | POA: Diagnosis not present

## 2014-10-13 DIAGNOSIS — I2510 Atherosclerotic heart disease of native coronary artery without angina pectoris: Secondary | ICD-10-CM | POA: Diagnosis not present

## 2014-10-13 DIAGNOSIS — E039 Hypothyroidism, unspecified: Secondary | ICD-10-CM | POA: Diagnosis not present

## 2014-10-13 DIAGNOSIS — M5030 Other cervical disc degeneration, unspecified cervical region: Secondary | ICD-10-CM | POA: Diagnosis not present

## 2014-10-13 DIAGNOSIS — I499 Cardiac arrhythmia, unspecified: Secondary | ICD-10-CM | POA: Diagnosis not present

## 2014-10-13 DIAGNOSIS — G40509 Epileptic seizures related to external causes, not intractable, without status epilepticus: Secondary | ICD-10-CM | POA: Diagnosis not present

## 2014-10-13 DIAGNOSIS — R51 Headache: Secondary | ICD-10-CM | POA: Diagnosis not present

## 2014-10-13 DIAGNOSIS — Z8673 Personal history of transient ischemic attack (TIA), and cerebral infarction without residual deficits: Secondary | ICD-10-CM | POA: Diagnosis not present

## 2014-11-16 DIAGNOSIS — R002 Palpitations: Secondary | ICD-10-CM | POA: Diagnosis not present

## 2014-11-16 DIAGNOSIS — R7889 Finding of other specified substances, not normally found in blood: Secondary | ICD-10-CM | POA: Diagnosis not present

## 2014-11-16 DIAGNOSIS — R0602 Shortness of breath: Secondary | ICD-10-CM | POA: Diagnosis not present

## 2014-11-17 DIAGNOSIS — I4891 Unspecified atrial fibrillation: Secondary | ICD-10-CM | POA: Diagnosis not present

## 2014-11-20 DIAGNOSIS — R002 Palpitations: Secondary | ICD-10-CM | POA: Diagnosis not present

## 2014-11-26 DIAGNOSIS — R002 Palpitations: Secondary | ICD-10-CM | POA: Diagnosis not present

## 2014-12-01 DIAGNOSIS — R789 Finding of unspecified substance, not normally found in blood: Secondary | ICD-10-CM | POA: Diagnosis not present

## 2014-12-01 DIAGNOSIS — R0602 Shortness of breath: Secondary | ICD-10-CM | POA: Diagnosis not present

## 2014-12-01 DIAGNOSIS — R002 Palpitations: Secondary | ICD-10-CM | POA: Diagnosis not present

## 2014-12-08 DIAGNOSIS — G40109 Localization-related (focal) (partial) symptomatic epilepsy and epileptic syndromes with simple partial seizures, not intractable, without status epilepticus: Secondary | ICD-10-CM | POA: Diagnosis not present

## 2014-12-30 DIAGNOSIS — R0602 Shortness of breath: Secondary | ICD-10-CM | POA: Diagnosis not present

## 2014-12-31 DIAGNOSIS — M5030 Other cervical disc degeneration, unspecified cervical region: Secondary | ICD-10-CM | POA: Diagnosis not present

## 2014-12-31 DIAGNOSIS — G40509 Epileptic seizures related to external causes, not intractable, without status epilepticus: Secondary | ICD-10-CM | POA: Diagnosis not present

## 2014-12-31 DIAGNOSIS — E039 Hypothyroidism, unspecified: Secondary | ICD-10-CM | POA: Diagnosis not present

## 2014-12-31 DIAGNOSIS — Z8673 Personal history of transient ischemic attack (TIA), and cerebral infarction without residual deficits: Secondary | ICD-10-CM | POA: Diagnosis not present

## 2014-12-31 DIAGNOSIS — R51 Headache: Secondary | ICD-10-CM | POA: Diagnosis not present

## 2015-02-02 DIAGNOSIS — G4733 Obstructive sleep apnea (adult) (pediatric): Secondary | ICD-10-CM | POA: Diagnosis not present

## 2015-03-05 DIAGNOSIS — E039 Hypothyroidism, unspecified: Secondary | ICD-10-CM | POA: Diagnosis not present

## 2015-03-05 DIAGNOSIS — G40509 Epileptic seizures related to external causes, not intractable, without status epilepticus: Secondary | ICD-10-CM | POA: Diagnosis not present

## 2015-03-05 DIAGNOSIS — E559 Vitamin D deficiency, unspecified: Secondary | ICD-10-CM | POA: Diagnosis not present

## 2015-03-05 DIAGNOSIS — E785 Hyperlipidemia, unspecified: Secondary | ICD-10-CM | POA: Diagnosis not present

## 2015-03-05 DIAGNOSIS — R51 Headache: Secondary | ICD-10-CM | POA: Diagnosis not present

## 2015-03-05 DIAGNOSIS — D692 Other nonthrombocytopenic purpura: Secondary | ICD-10-CM | POA: Diagnosis not present

## 2015-03-05 DIAGNOSIS — Z79899 Other long term (current) drug therapy: Secondary | ICD-10-CM | POA: Diagnosis not present

## 2015-05-06 DIAGNOSIS — E039 Hypothyroidism, unspecified: Secondary | ICD-10-CM | POA: Diagnosis not present

## 2015-05-12 DIAGNOSIS — G40109 Localization-related (focal) (partial) symptomatic epilepsy and epileptic syndromes with simple partial seizures, not intractable, without status epilepticus: Secondary | ICD-10-CM | POA: Diagnosis not present

## 2015-06-08 DIAGNOSIS — E559 Vitamin D deficiency, unspecified: Secondary | ICD-10-CM | POA: Diagnosis not present

## 2015-07-05 DIAGNOSIS — I2510 Atherosclerotic heart disease of native coronary artery without angina pectoris: Secondary | ICD-10-CM | POA: Diagnosis not present

## 2015-07-05 DIAGNOSIS — G40509 Epileptic seizures related to external causes, not intractable, without status epilepticus: Secondary | ICD-10-CM | POA: Diagnosis not present

## 2015-07-05 DIAGNOSIS — Z79899 Other long term (current) drug therapy: Secondary | ICD-10-CM | POA: Diagnosis not present

## 2015-07-05 DIAGNOSIS — R252 Cramp and spasm: Secondary | ICD-10-CM | POA: Diagnosis not present

## 2015-07-05 DIAGNOSIS — R51 Headache: Secondary | ICD-10-CM | POA: Diagnosis not present

## 2015-07-05 DIAGNOSIS — E039 Hypothyroidism, unspecified: Secondary | ICD-10-CM | POA: Diagnosis not present

## 2015-07-05 DIAGNOSIS — D649 Anemia, unspecified: Secondary | ICD-10-CM | POA: Diagnosis not present

## 2015-07-05 DIAGNOSIS — E785 Hyperlipidemia, unspecified: Secondary | ICD-10-CM | POA: Diagnosis not present

## 2015-08-16 DIAGNOSIS — R7989 Other specified abnormal findings of blood chemistry: Secondary | ICD-10-CM | POA: Diagnosis not present

## 2015-09-02 DIAGNOSIS — R51 Headache: Secondary | ICD-10-CM | POA: Diagnosis not present

## 2015-09-02 DIAGNOSIS — M7989 Other specified soft tissue disorders: Secondary | ICD-10-CM | POA: Diagnosis not present

## 2015-09-02 DIAGNOSIS — M5030 Other cervical disc degeneration, unspecified cervical region: Secondary | ICD-10-CM | POA: Diagnosis not present

## 2015-09-02 DIAGNOSIS — R42 Dizziness and giddiness: Secondary | ICD-10-CM | POA: Diagnosis not present

## 2015-09-09 DIAGNOSIS — H2511 Age-related nuclear cataract, right eye: Secondary | ICD-10-CM | POA: Diagnosis not present

## 2015-09-09 DIAGNOSIS — H25811 Combined forms of age-related cataract, right eye: Secondary | ICD-10-CM | POA: Diagnosis not present

## 2015-09-09 DIAGNOSIS — H353113 Nonexudative age-related macular degeneration, right eye, advanced atrophic without subfoveal involvement: Secondary | ICD-10-CM | POA: Diagnosis not present

## 2015-09-20 DIAGNOSIS — H25812 Combined forms of age-related cataract, left eye: Secondary | ICD-10-CM | POA: Diagnosis not present

## 2015-09-20 DIAGNOSIS — H2512 Age-related nuclear cataract, left eye: Secondary | ICD-10-CM | POA: Diagnosis not present

## 2015-09-20 DIAGNOSIS — B029 Zoster without complications: Secondary | ICD-10-CM | POA: Diagnosis not present

## 2015-09-29 DIAGNOSIS — B029 Zoster without complications: Secondary | ICD-10-CM | POA: Diagnosis not present

## 2015-11-02 DIAGNOSIS — E039 Hypothyroidism, unspecified: Secondary | ICD-10-CM | POA: Diagnosis not present

## 2015-11-02 DIAGNOSIS — G40509 Epileptic seizures related to external causes, not intractable, without status epilepticus: Secondary | ICD-10-CM | POA: Diagnosis not present

## 2015-11-02 DIAGNOSIS — D649 Anemia, unspecified: Secondary | ICD-10-CM | POA: Diagnosis not present

## 2015-11-02 DIAGNOSIS — R748 Abnormal levels of other serum enzymes: Secondary | ICD-10-CM | POA: Diagnosis not present

## 2015-11-02 DIAGNOSIS — Z79899 Other long term (current) drug therapy: Secondary | ICD-10-CM | POA: Diagnosis not present

## 2015-11-02 DIAGNOSIS — R252 Cramp and spasm: Secondary | ICD-10-CM | POA: Diagnosis not present

## 2015-11-02 DIAGNOSIS — E785 Hyperlipidemia, unspecified: Secondary | ICD-10-CM | POA: Diagnosis not present

## 2015-11-02 DIAGNOSIS — E538 Deficiency of other specified B group vitamins: Secondary | ICD-10-CM | POA: Diagnosis not present

## 2015-11-02 DIAGNOSIS — E559 Vitamin D deficiency, unspecified: Secondary | ICD-10-CM | POA: Diagnosis not present

## 2015-12-27 DIAGNOSIS — I10 Essential (primary) hypertension: Secondary | ICD-10-CM | POA: Diagnosis not present

## 2015-12-27 DIAGNOSIS — R51 Headache: Secondary | ICD-10-CM | POA: Diagnosis not present

## 2016-01-11 DIAGNOSIS — R51 Headache: Secondary | ICD-10-CM | POA: Diagnosis not present

## 2016-01-11 DIAGNOSIS — Z79899 Other long term (current) drug therapy: Secondary | ICD-10-CM | POA: Diagnosis not present

## 2016-01-11 DIAGNOSIS — Z23 Encounter for immunization: Secondary | ICD-10-CM | POA: Diagnosis not present

## 2016-01-11 DIAGNOSIS — I10 Essential (primary) hypertension: Secondary | ICD-10-CM | POA: Diagnosis not present

## 2016-01-11 DIAGNOSIS — E039 Hypothyroidism, unspecified: Secondary | ICD-10-CM | POA: Diagnosis not present

## 2016-02-08 DIAGNOSIS — I10 Essential (primary) hypertension: Secondary | ICD-10-CM | POA: Diagnosis not present

## 2016-02-08 DIAGNOSIS — Z79899 Other long term (current) drug therapy: Secondary | ICD-10-CM | POA: Diagnosis not present

## 2016-02-08 DIAGNOSIS — R51 Headache: Secondary | ICD-10-CM | POA: Diagnosis not present

## 2016-04-04 DIAGNOSIS — R51 Headache: Secondary | ICD-10-CM | POA: Diagnosis not present

## 2016-04-04 DIAGNOSIS — E559 Vitamin D deficiency, unspecified: Secondary | ICD-10-CM | POA: Diagnosis not present

## 2016-04-04 DIAGNOSIS — I10 Essential (primary) hypertension: Secondary | ICD-10-CM | POA: Diagnosis not present

## 2016-04-04 DIAGNOSIS — I2510 Atherosclerotic heart disease of native coronary artery without angina pectoris: Secondary | ICD-10-CM | POA: Diagnosis not present

## 2016-04-04 DIAGNOSIS — E785 Hyperlipidemia, unspecified: Secondary | ICD-10-CM | POA: Diagnosis not present

## 2016-04-04 DIAGNOSIS — G40509 Epileptic seizures related to external causes, not intractable, without status epilepticus: Secondary | ICD-10-CM | POA: Diagnosis not present

## 2016-04-04 DIAGNOSIS — Z79899 Other long term (current) drug therapy: Secondary | ICD-10-CM | POA: Diagnosis not present

## 2016-04-04 DIAGNOSIS — E039 Hypothyroidism, unspecified: Secondary | ICD-10-CM | POA: Diagnosis not present

## 2016-05-04 DIAGNOSIS — L304 Erythema intertrigo: Secondary | ICD-10-CM | POA: Diagnosis not present

## 2016-05-16 DIAGNOSIS — G4440 Drug-induced headache, not elsewhere classified, not intractable: Secondary | ICD-10-CM | POA: Insufficient documentation

## 2016-05-23 DIAGNOSIS — I2510 Atherosclerotic heart disease of native coronary artery without angina pectoris: Secondary | ICD-10-CM | POA: Diagnosis not present

## 2016-05-23 DIAGNOSIS — E039 Hypothyroidism, unspecified: Secondary | ICD-10-CM | POA: Diagnosis not present

## 2016-05-23 DIAGNOSIS — G40509 Epileptic seizures related to external causes, not intractable, without status epilepticus: Secondary | ICD-10-CM | POA: Diagnosis not present

## 2016-05-23 DIAGNOSIS — I10 Essential (primary) hypertension: Secondary | ICD-10-CM | POA: Diagnosis not present

## 2016-05-23 DIAGNOSIS — R51 Headache: Secondary | ICD-10-CM | POA: Diagnosis not present

## 2016-06-26 DIAGNOSIS — R42 Dizziness and giddiness: Secondary | ICD-10-CM | POA: Diagnosis not present

## 2016-06-26 DIAGNOSIS — R209 Unspecified disturbances of skin sensation: Secondary | ICD-10-CM | POA: Diagnosis not present

## 2016-06-26 DIAGNOSIS — R51 Headache: Secondary | ICD-10-CM | POA: Diagnosis not present

## 2016-06-26 DIAGNOSIS — M5030 Other cervical disc degeneration, unspecified cervical region: Secondary | ICD-10-CM | POA: Diagnosis not present

## 2016-06-26 DIAGNOSIS — I10 Essential (primary) hypertension: Secondary | ICD-10-CM | POA: Diagnosis not present

## 2016-07-17 DIAGNOSIS — I6523 Occlusion and stenosis of bilateral carotid arteries: Secondary | ICD-10-CM | POA: Diagnosis not present

## 2016-07-17 DIAGNOSIS — R42 Dizziness and giddiness: Secondary | ICD-10-CM | POA: Diagnosis not present

## 2016-08-21 DIAGNOSIS — R51 Headache: Secondary | ICD-10-CM | POA: Diagnosis not present

## 2016-08-21 DIAGNOSIS — I10 Essential (primary) hypertension: Secondary | ICD-10-CM | POA: Diagnosis not present

## 2016-08-21 DIAGNOSIS — Z79899 Other long term (current) drug therapy: Secondary | ICD-10-CM | POA: Diagnosis not present

## 2016-08-21 DIAGNOSIS — E559 Vitamin D deficiency, unspecified: Secondary | ICD-10-CM | POA: Diagnosis not present

## 2016-08-21 DIAGNOSIS — E785 Hyperlipidemia, unspecified: Secondary | ICD-10-CM | POA: Diagnosis not present

## 2016-08-21 DIAGNOSIS — E538 Deficiency of other specified B group vitamins: Secondary | ICD-10-CM | POA: Diagnosis not present

## 2016-08-21 DIAGNOSIS — M5030 Other cervical disc degeneration, unspecified cervical region: Secondary | ICD-10-CM | POA: Diagnosis not present

## 2016-08-21 DIAGNOSIS — E039 Hypothyroidism, unspecified: Secondary | ICD-10-CM | POA: Diagnosis not present

## 2016-08-21 DIAGNOSIS — G40509 Epileptic seizures related to external causes, not intractable, without status epilepticus: Secondary | ICD-10-CM | POA: Diagnosis not present

## 2016-09-15 DIAGNOSIS — I10 Essential (primary) hypertension: Secondary | ICD-10-CM | POA: Diagnosis not present

## 2016-09-27 DIAGNOSIS — H353133 Nonexudative age-related macular degeneration, bilateral, advanced atrophic without subfoveal involvement: Secondary | ICD-10-CM | POA: Diagnosis not present

## 2016-12-13 DIAGNOSIS — E039 Hypothyroidism, unspecified: Secondary | ICD-10-CM | POA: Diagnosis not present

## 2016-12-13 DIAGNOSIS — E559 Vitamin D deficiency, unspecified: Secondary | ICD-10-CM | POA: Diagnosis not present

## 2016-12-13 DIAGNOSIS — Z79899 Other long term (current) drug therapy: Secondary | ICD-10-CM | POA: Diagnosis not present

## 2016-12-13 DIAGNOSIS — I10 Essential (primary) hypertension: Secondary | ICD-10-CM | POA: Diagnosis not present

## 2016-12-13 DIAGNOSIS — R51 Headache: Secondary | ICD-10-CM | POA: Diagnosis not present

## 2016-12-13 DIAGNOSIS — E785 Hyperlipidemia, unspecified: Secondary | ICD-10-CM | POA: Diagnosis not present

## 2017-01-17 DIAGNOSIS — R7989 Other specified abnormal findings of blood chemistry: Secondary | ICD-10-CM | POA: Diagnosis not present

## 2017-03-15 DIAGNOSIS — E039 Hypothyroidism, unspecified: Secondary | ICD-10-CM | POA: Diagnosis not present

## 2017-03-15 DIAGNOSIS — R51 Headache: Secondary | ICD-10-CM | POA: Diagnosis not present

## 2017-03-15 DIAGNOSIS — Z79899 Other long term (current) drug therapy: Secondary | ICD-10-CM | POA: Diagnosis not present

## 2017-03-15 DIAGNOSIS — I10 Essential (primary) hypertension: Secondary | ICD-10-CM | POA: Diagnosis not present

## 2017-03-15 DIAGNOSIS — I2510 Atherosclerotic heart disease of native coronary artery without angina pectoris: Secondary | ICD-10-CM | POA: Diagnosis not present

## 2017-03-15 DIAGNOSIS — E785 Hyperlipidemia, unspecified: Secondary | ICD-10-CM | POA: Diagnosis not present

## 2017-03-15 DIAGNOSIS — G40509 Epileptic seizures related to external causes, not intractable, without status epilepticus: Secondary | ICD-10-CM | POA: Diagnosis not present

## 2017-07-12 DIAGNOSIS — E785 Hyperlipidemia, unspecified: Secondary | ICD-10-CM | POA: Diagnosis not present

## 2017-07-12 DIAGNOSIS — I2510 Atherosclerotic heart disease of native coronary artery without angina pectoris: Secondary | ICD-10-CM | POA: Diagnosis not present

## 2017-07-12 DIAGNOSIS — E039 Hypothyroidism, unspecified: Secondary | ICD-10-CM | POA: Diagnosis not present

## 2017-07-12 DIAGNOSIS — G40509 Epileptic seizures related to external causes, not intractable, without status epilepticus: Secondary | ICD-10-CM | POA: Diagnosis not present

## 2017-07-12 DIAGNOSIS — M5030 Other cervical disc degeneration, unspecified cervical region: Secondary | ICD-10-CM | POA: Diagnosis not present

## 2017-07-12 DIAGNOSIS — R51 Headache: Secondary | ICD-10-CM | POA: Diagnosis not present

## 2017-07-12 DIAGNOSIS — I10 Essential (primary) hypertension: Secondary | ICD-10-CM | POA: Diagnosis not present

## 2017-09-04 DIAGNOSIS — R1011 Right upper quadrant pain: Secondary | ICD-10-CM | POA: Diagnosis not present

## 2017-09-04 DIAGNOSIS — K8020 Calculus of gallbladder without cholecystitis without obstruction: Secondary | ICD-10-CM | POA: Diagnosis not present

## 2017-09-10 DIAGNOSIS — R1011 Right upper quadrant pain: Secondary | ICD-10-CM | POA: Diagnosis not present

## 2017-09-10 DIAGNOSIS — K8010 Calculus of gallbladder with chronic cholecystitis without obstruction: Secondary | ICD-10-CM | POA: Diagnosis not present

## 2017-09-14 DIAGNOSIS — K8020 Calculus of gallbladder without cholecystitis without obstruction: Secondary | ICD-10-CM | POA: Diagnosis not present

## 2017-09-14 DIAGNOSIS — R1011 Right upper quadrant pain: Secondary | ICD-10-CM | POA: Diagnosis not present

## 2017-10-03 DIAGNOSIS — I739 Peripheral vascular disease, unspecified: Secondary | ICD-10-CM | POA: Diagnosis not present

## 2017-10-03 DIAGNOSIS — K219 Gastro-esophageal reflux disease without esophagitis: Secondary | ICD-10-CM | POA: Diagnosis not present

## 2017-10-03 DIAGNOSIS — Z8669 Personal history of other diseases of the nervous system and sense organs: Secondary | ICD-10-CM | POA: Diagnosis not present

## 2017-10-03 DIAGNOSIS — Z8673 Personal history of transient ischemic attack (TIA), and cerebral infarction without residual deficits: Secondary | ICD-10-CM | POA: Diagnosis not present

## 2017-10-03 DIAGNOSIS — Z01818 Encounter for other preprocedural examination: Secondary | ICD-10-CM | POA: Diagnosis not present

## 2017-10-03 DIAGNOSIS — E559 Vitamin D deficiency, unspecified: Secondary | ICD-10-CM | POA: Diagnosis not present

## 2017-10-03 DIAGNOSIS — I10 Essential (primary) hypertension: Secondary | ICD-10-CM | POA: Diagnosis not present

## 2017-10-03 DIAGNOSIS — E7800 Pure hypercholesterolemia, unspecified: Secondary | ICD-10-CM | POA: Diagnosis not present

## 2017-10-03 DIAGNOSIS — E079 Disorder of thyroid, unspecified: Secondary | ICD-10-CM | POA: Diagnosis not present

## 2017-10-03 DIAGNOSIS — I2510 Atherosclerotic heart disease of native coronary artery without angina pectoris: Secondary | ICD-10-CM | POA: Diagnosis not present

## 2017-10-03 DIAGNOSIS — K8010 Calculus of gallbladder with chronic cholecystitis without obstruction: Secondary | ICD-10-CM | POA: Diagnosis not present

## 2017-10-10 DIAGNOSIS — I2510 Atherosclerotic heart disease of native coronary artery without angina pectoris: Secondary | ICD-10-CM | POA: Diagnosis not present

## 2017-10-10 DIAGNOSIS — Z79899 Other long term (current) drug therapy: Secondary | ICD-10-CM | POA: Diagnosis not present

## 2017-10-10 DIAGNOSIS — G40509 Epileptic seizures related to external causes, not intractable, without status epilepticus: Secondary | ICD-10-CM | POA: Diagnosis not present

## 2017-10-10 DIAGNOSIS — I10 Essential (primary) hypertension: Secondary | ICD-10-CM | POA: Diagnosis not present

## 2017-10-10 DIAGNOSIS — E039 Hypothyroidism, unspecified: Secondary | ICD-10-CM | POA: Diagnosis not present

## 2017-10-10 DIAGNOSIS — E785 Hyperlipidemia, unspecified: Secondary | ICD-10-CM | POA: Diagnosis not present

## 2017-10-10 DIAGNOSIS — R51 Headache: Secondary | ICD-10-CM | POA: Diagnosis not present

## 2017-10-10 DIAGNOSIS — M5030 Other cervical disc degeneration, unspecified cervical region: Secondary | ICD-10-CM | POA: Diagnosis not present

## 2017-11-12 DIAGNOSIS — D649 Anemia, unspecified: Secondary | ICD-10-CM | POA: Diagnosis not present

## 2017-12-18 DIAGNOSIS — Z7189 Other specified counseling: Secondary | ICD-10-CM | POA: Diagnosis not present

## 2017-12-18 DIAGNOSIS — Z1331 Encounter for screening for depression: Secondary | ICD-10-CM | POA: Diagnosis not present

## 2017-12-18 DIAGNOSIS — Z0001 Encounter for general adult medical examination with abnormal findings: Secondary | ICD-10-CM | POA: Diagnosis not present

## 2017-12-18 DIAGNOSIS — Z125 Encounter for screening for malignant neoplasm of prostate: Secondary | ICD-10-CM | POA: Diagnosis not present

## 2017-12-18 DIAGNOSIS — Z1339 Encounter for screening examination for other mental health and behavioral disorders: Secondary | ICD-10-CM | POA: Diagnosis not present

## 2017-12-18 DIAGNOSIS — Z6823 Body mass index (BMI) 23.0-23.9, adult: Secondary | ICD-10-CM | POA: Diagnosis not present

## 2017-12-18 DIAGNOSIS — Z1211 Encounter for screening for malignant neoplasm of colon: Secondary | ICD-10-CM | POA: Diagnosis not present

## 2017-12-27 DIAGNOSIS — Z1212 Encounter for screening for malignant neoplasm of rectum: Secondary | ICD-10-CM | POA: Diagnosis not present

## 2017-12-27 DIAGNOSIS — Z1211 Encounter for screening for malignant neoplasm of colon: Secondary | ICD-10-CM | POA: Diagnosis not present

## 2018-01-16 DIAGNOSIS — Z79899 Other long term (current) drug therapy: Secondary | ICD-10-CM | POA: Diagnosis not present

## 2018-01-16 DIAGNOSIS — D649 Anemia, unspecified: Secondary | ICD-10-CM | POA: Diagnosis not present

## 2018-01-16 DIAGNOSIS — D692 Other nonthrombocytopenic purpura: Secondary | ICD-10-CM | POA: Diagnosis not present

## 2018-01-16 DIAGNOSIS — J040 Acute laryngitis: Secondary | ICD-10-CM | POA: Diagnosis not present

## 2018-01-16 DIAGNOSIS — R51 Headache: Secondary | ICD-10-CM | POA: Diagnosis not present

## 2018-01-16 DIAGNOSIS — E785 Hyperlipidemia, unspecified: Secondary | ICD-10-CM | POA: Diagnosis not present

## 2018-01-16 DIAGNOSIS — E039 Hypothyroidism, unspecified: Secondary | ICD-10-CM | POA: Diagnosis not present

## 2018-01-16 DIAGNOSIS — E538 Deficiency of other specified B group vitamins: Secondary | ICD-10-CM | POA: Diagnosis not present

## 2018-01-16 DIAGNOSIS — Z6823 Body mass index (BMI) 23.0-23.9, adult: Secondary | ICD-10-CM | POA: Diagnosis not present

## 2018-01-16 DIAGNOSIS — I10 Essential (primary) hypertension: Secondary | ICD-10-CM | POA: Diagnosis not present

## 2018-02-15 DIAGNOSIS — S62324A Displaced fracture of shaft of fourth metacarpal bone, right hand, initial encounter for closed fracture: Secondary | ICD-10-CM | POA: Diagnosis not present

## 2018-02-15 DIAGNOSIS — S62326A Displaced fracture of shaft of fifth metacarpal bone, right hand, initial encounter for closed fracture: Secondary | ICD-10-CM | POA: Diagnosis not present

## 2018-02-15 DIAGNOSIS — S6991XA Unspecified injury of right wrist, hand and finger(s), initial encounter: Secondary | ICD-10-CM | POA: Diagnosis not present

## 2018-03-01 DIAGNOSIS — D649 Anemia, unspecified: Secondary | ICD-10-CM | POA: Diagnosis not present

## 2018-03-01 DIAGNOSIS — Z79899 Other long term (current) drug therapy: Secondary | ICD-10-CM | POA: Diagnosis not present

## 2018-03-01 DIAGNOSIS — I10 Essential (primary) hypertension: Secondary | ICD-10-CM | POA: Diagnosis not present

## 2018-03-01 DIAGNOSIS — Z6822 Body mass index (BMI) 22.0-22.9, adult: Secondary | ICD-10-CM | POA: Diagnosis not present

## 2018-03-12 DIAGNOSIS — E039 Hypothyroidism, unspecified: Secondary | ICD-10-CM | POA: Diagnosis not present

## 2018-03-12 DIAGNOSIS — M5030 Other cervical disc degeneration, unspecified cervical region: Secondary | ICD-10-CM | POA: Diagnosis not present

## 2018-03-12 DIAGNOSIS — E785 Hyperlipidemia, unspecified: Secondary | ICD-10-CM | POA: Diagnosis not present

## 2018-03-12 DIAGNOSIS — Z6822 Body mass index (BMI) 22.0-22.9, adult: Secondary | ICD-10-CM | POA: Diagnosis not present

## 2018-03-12 DIAGNOSIS — G40509 Epileptic seizures related to external causes, not intractable, without status epilepticus: Secondary | ICD-10-CM | POA: Diagnosis not present

## 2018-03-12 DIAGNOSIS — R51 Headache: Secondary | ICD-10-CM | POA: Diagnosis not present

## 2018-03-12 DIAGNOSIS — I2510 Atherosclerotic heart disease of native coronary artery without angina pectoris: Secondary | ICD-10-CM | POA: Diagnosis not present

## 2018-03-12 DIAGNOSIS — I10 Essential (primary) hypertension: Secondary | ICD-10-CM | POA: Diagnosis not present

## 2018-03-18 DIAGNOSIS — R339 Retention of urine, unspecified: Secondary | ICD-10-CM | POA: Diagnosis not present

## 2018-03-18 DIAGNOSIS — R7989 Other specified abnormal findings of blood chemistry: Secondary | ICD-10-CM | POA: Diagnosis not present

## 2018-03-18 DIAGNOSIS — Z87891 Personal history of nicotine dependence: Secondary | ICD-10-CM | POA: Diagnosis not present

## 2018-03-19 DIAGNOSIS — Z6822 Body mass index (BMI) 22.0-22.9, adult: Secondary | ICD-10-CM | POA: Diagnosis not present

## 2018-03-19 DIAGNOSIS — R339 Retention of urine, unspecified: Secondary | ICD-10-CM | POA: Diagnosis not present

## 2018-03-21 DIAGNOSIS — N401 Enlarged prostate with lower urinary tract symptoms: Secondary | ICD-10-CM | POA: Diagnosis not present

## 2018-03-21 DIAGNOSIS — R338 Other retention of urine: Secondary | ICD-10-CM | POA: Diagnosis not present

## 2018-03-21 DIAGNOSIS — N3000 Acute cystitis without hematuria: Secondary | ICD-10-CM | POA: Diagnosis not present

## 2018-03-22 DIAGNOSIS — R339 Retention of urine, unspecified: Secondary | ICD-10-CM | POA: Diagnosis not present

## 2018-03-22 DIAGNOSIS — R1030 Lower abdominal pain, unspecified: Secondary | ICD-10-CM | POA: Diagnosis not present

## 2018-03-22 DIAGNOSIS — R338 Other retention of urine: Secondary | ICD-10-CM | POA: Diagnosis not present

## 2018-03-22 DIAGNOSIS — N401 Enlarged prostate with lower urinary tract symptoms: Secondary | ICD-10-CM | POA: Diagnosis not present

## 2018-03-22 DIAGNOSIS — N179 Acute kidney failure, unspecified: Secondary | ICD-10-CM | POA: Diagnosis not present

## 2018-03-22 DIAGNOSIS — N19 Unspecified kidney failure: Secondary | ICD-10-CM | POA: Diagnosis not present

## 2018-03-25 DIAGNOSIS — N401 Enlarged prostate with lower urinary tract symptoms: Secondary | ICD-10-CM | POA: Diagnosis not present

## 2018-03-25 DIAGNOSIS — R351 Nocturia: Secondary | ICD-10-CM | POA: Diagnosis not present

## 2018-03-25 DIAGNOSIS — N3020 Other chronic cystitis without hematuria: Secondary | ICD-10-CM | POA: Diagnosis not present

## 2018-03-25 DIAGNOSIS — N3090 Cystitis, unspecified without hematuria: Secondary | ICD-10-CM | POA: Diagnosis not present

## 2018-03-29 DIAGNOSIS — E039 Hypothyroidism, unspecified: Secondary | ICD-10-CM | POA: Diagnosis not present

## 2018-04-04 DIAGNOSIS — I10 Essential (primary) hypertension: Secondary | ICD-10-CM | POA: Diagnosis not present

## 2018-04-04 DIAGNOSIS — F419 Anxiety disorder, unspecified: Secondary | ICD-10-CM | POA: Diagnosis not present

## 2018-04-04 DIAGNOSIS — E559 Vitamin D deficiency, unspecified: Secondary | ICD-10-CM | POA: Diagnosis not present

## 2018-04-04 DIAGNOSIS — R351 Nocturia: Secondary | ICD-10-CM | POA: Diagnosis not present

## 2018-04-04 DIAGNOSIS — Z79899 Other long term (current) drug therapy: Secondary | ICD-10-CM | POA: Diagnosis not present

## 2018-04-04 DIAGNOSIS — D649 Anemia, unspecified: Secondary | ICD-10-CM | POA: Diagnosis not present

## 2018-04-04 DIAGNOSIS — E039 Hypothyroidism, unspecified: Secondary | ICD-10-CM | POA: Diagnosis not present

## 2018-04-04 DIAGNOSIS — I2510 Atherosclerotic heart disease of native coronary artery without angina pectoris: Secondary | ICD-10-CM | POA: Diagnosis not present

## 2018-04-04 DIAGNOSIS — R338 Other retention of urine: Secondary | ICD-10-CM | POA: Diagnosis not present

## 2018-04-04 DIAGNOSIS — N401 Enlarged prostate with lower urinary tract symptoms: Secondary | ICD-10-CM | POA: Diagnosis not present

## 2018-04-04 DIAGNOSIS — Z8673 Personal history of transient ischemic attack (TIA), and cerebral infarction without residual deficits: Secondary | ICD-10-CM | POA: Diagnosis not present

## 2018-04-04 DIAGNOSIS — Z7902 Long term (current) use of antithrombotics/antiplatelets: Secondary | ICD-10-CM | POA: Diagnosis not present

## 2018-04-04 DIAGNOSIS — E538 Deficiency of other specified B group vitamins: Secondary | ICD-10-CM | POA: Diagnosis not present

## 2018-04-05 DIAGNOSIS — T8389XA Other specified complication of genitourinary prosthetic devices, implants and grafts, initial encounter: Secondary | ICD-10-CM | POA: Diagnosis not present

## 2018-04-05 DIAGNOSIS — R319 Hematuria, unspecified: Secondary | ICD-10-CM | POA: Diagnosis not present

## 2018-04-05 DIAGNOSIS — Z87891 Personal history of nicotine dependence: Secondary | ICD-10-CM | POA: Diagnosis not present

## 2018-04-05 DIAGNOSIS — T83018A Breakdown (mechanical) of other indwelling urethral catheter, initial encounter: Secondary | ICD-10-CM | POA: Diagnosis not present

## 2018-04-07 DIAGNOSIS — N401 Enlarged prostate with lower urinary tract symptoms: Secondary | ICD-10-CM | POA: Diagnosis not present

## 2018-04-07 DIAGNOSIS — N3289 Other specified disorders of bladder: Secondary | ICD-10-CM | POA: Diagnosis not present

## 2018-04-07 DIAGNOSIS — Z87891 Personal history of nicotine dependence: Secondary | ICD-10-CM | POA: Diagnosis not present

## 2018-04-07 DIAGNOSIS — T83098A Other mechanical complication of other indwelling urethral catheter, initial encounter: Secondary | ICD-10-CM | POA: Diagnosis not present

## 2018-04-08 DIAGNOSIS — N3020 Other chronic cystitis without hematuria: Secondary | ICD-10-CM | POA: Diagnosis not present

## 2018-04-08 DIAGNOSIS — N401 Enlarged prostate with lower urinary tract symptoms: Secondary | ICD-10-CM | POA: Diagnosis not present

## 2018-04-08 DIAGNOSIS — R338 Other retention of urine: Secondary | ICD-10-CM | POA: Diagnosis not present

## 2018-04-15 DIAGNOSIS — N3020 Other chronic cystitis without hematuria: Secondary | ICD-10-CM | POA: Diagnosis not present

## 2018-04-15 DIAGNOSIS — N401 Enlarged prostate with lower urinary tract symptoms: Secondary | ICD-10-CM | POA: Diagnosis not present

## 2018-04-15 DIAGNOSIS — R338 Other retention of urine: Secondary | ICD-10-CM | POA: Diagnosis not present

## 2018-05-13 DIAGNOSIS — R351 Nocturia: Secondary | ICD-10-CM | POA: Diagnosis not present

## 2018-05-13 DIAGNOSIS — N401 Enlarged prostate with lower urinary tract symptoms: Secondary | ICD-10-CM | POA: Diagnosis not present

## 2018-05-13 DIAGNOSIS — N3020 Other chronic cystitis without hematuria: Secondary | ICD-10-CM | POA: Diagnosis not present

## 2018-05-20 DIAGNOSIS — E785 Hyperlipidemia, unspecified: Secondary | ICD-10-CM | POA: Diagnosis not present

## 2018-05-20 DIAGNOSIS — R51 Headache: Secondary | ICD-10-CM | POA: Diagnosis not present

## 2018-05-20 DIAGNOSIS — E039 Hypothyroidism, unspecified: Secondary | ICD-10-CM | POA: Diagnosis not present

## 2018-05-20 DIAGNOSIS — I10 Essential (primary) hypertension: Secondary | ICD-10-CM | POA: Diagnosis not present

## 2018-05-20 DIAGNOSIS — Z6822 Body mass index (BMI) 22.0-22.9, adult: Secondary | ICD-10-CM | POA: Diagnosis not present

## 2018-08-20 DIAGNOSIS — I10 Essential (primary) hypertension: Secondary | ICD-10-CM | POA: Diagnosis not present

## 2018-08-20 DIAGNOSIS — G40509 Epileptic seizures related to external causes, not intractable, without status epilepticus: Secondary | ICD-10-CM | POA: Diagnosis not present

## 2018-08-20 DIAGNOSIS — Z6821 Body mass index (BMI) 21.0-21.9, adult: Secondary | ICD-10-CM | POA: Diagnosis not present

## 2018-08-20 DIAGNOSIS — R51 Headache: Secondary | ICD-10-CM | POA: Diagnosis not present

## 2018-08-20 DIAGNOSIS — Z79899 Other long term (current) drug therapy: Secondary | ICD-10-CM | POA: Diagnosis not present

## 2018-08-20 DIAGNOSIS — E785 Hyperlipidemia, unspecified: Secondary | ICD-10-CM | POA: Diagnosis not present

## 2018-08-20 DIAGNOSIS — E039 Hypothyroidism, unspecified: Secondary | ICD-10-CM | POA: Diagnosis not present

## 2018-10-03 ENCOUNTER — Other Ambulatory Visit: Payer: Self-pay

## 2018-10-15 DIAGNOSIS — Z79899 Other long term (current) drug therapy: Secondary | ICD-10-CM | POA: Diagnosis not present

## 2018-10-15 DIAGNOSIS — E039 Hypothyroidism, unspecified: Secondary | ICD-10-CM | POA: Diagnosis not present

## 2018-11-07 DIAGNOSIS — Z79899 Other long term (current) drug therapy: Secondary | ICD-10-CM | POA: Diagnosis not present

## 2018-11-21 DIAGNOSIS — G40509 Epileptic seizures related to external causes, not intractable, without status epilepticus: Secondary | ICD-10-CM | POA: Diagnosis not present

## 2018-11-21 DIAGNOSIS — M5030 Other cervical disc degeneration, unspecified cervical region: Secondary | ICD-10-CM | POA: Diagnosis not present

## 2018-11-21 DIAGNOSIS — Z6823 Body mass index (BMI) 23.0-23.9, adult: Secondary | ICD-10-CM | POA: Diagnosis not present

## 2018-11-29 DIAGNOSIS — M542 Cervicalgia: Secondary | ICD-10-CM | POA: Diagnosis not present

## 2018-11-29 DIAGNOSIS — M5030 Other cervical disc degeneration, unspecified cervical region: Secondary | ICD-10-CM | POA: Diagnosis not present

## 2018-12-23 DIAGNOSIS — Z7189 Other specified counseling: Secondary | ICD-10-CM | POA: Diagnosis not present

## 2018-12-23 DIAGNOSIS — Z1331 Encounter for screening for depression: Secondary | ICD-10-CM | POA: Diagnosis not present

## 2018-12-23 DIAGNOSIS — Z1339 Encounter for screening examination for other mental health and behavioral disorders: Secondary | ICD-10-CM | POA: Diagnosis not present

## 2018-12-23 DIAGNOSIS — Z6823 Body mass index (BMI) 23.0-23.9, adult: Secondary | ICD-10-CM | POA: Diagnosis not present

## 2018-12-23 DIAGNOSIS — Z0000 Encounter for general adult medical examination without abnormal findings: Secondary | ICD-10-CM | POA: Diagnosis not present

## 2019-01-17 DIAGNOSIS — H353133 Nonexudative age-related macular degeneration, bilateral, advanced atrophic without subfoveal involvement: Secondary | ICD-10-CM | POA: Diagnosis not present

## 2019-01-20 DIAGNOSIS — E785 Hyperlipidemia, unspecified: Secondary | ICD-10-CM | POA: Diagnosis not present

## 2019-01-20 DIAGNOSIS — I10 Essential (primary) hypertension: Secondary | ICD-10-CM | POA: Diagnosis not present

## 2019-01-20 DIAGNOSIS — E538 Deficiency of other specified B group vitamins: Secondary | ICD-10-CM | POA: Diagnosis not present

## 2019-01-20 DIAGNOSIS — D649 Anemia, unspecified: Secondary | ICD-10-CM | POA: Diagnosis not present

## 2019-01-20 DIAGNOSIS — E039 Hypothyroidism, unspecified: Secondary | ICD-10-CM | POA: Diagnosis not present

## 2019-01-20 DIAGNOSIS — Z79899 Other long term (current) drug therapy: Secondary | ICD-10-CM | POA: Diagnosis not present

## 2019-04-23 DIAGNOSIS — E039 Hypothyroidism, unspecified: Secondary | ICD-10-CM | POA: Diagnosis not present

## 2019-04-23 DIAGNOSIS — I10 Essential (primary) hypertension: Secondary | ICD-10-CM | POA: Diagnosis not present

## 2019-04-23 DIAGNOSIS — Z1331 Encounter for screening for depression: Secondary | ICD-10-CM | POA: Diagnosis not present

## 2019-04-23 DIAGNOSIS — N1832 Chronic kidney disease, stage 3b: Secondary | ICD-10-CM | POA: Diagnosis not present

## 2019-04-23 DIAGNOSIS — Z6824 Body mass index (BMI) 24.0-24.9, adult: Secondary | ICD-10-CM | POA: Diagnosis not present

## 2019-04-23 DIAGNOSIS — E785 Hyperlipidemia, unspecified: Secondary | ICD-10-CM | POA: Diagnosis not present

## 2019-04-23 DIAGNOSIS — Z79899 Other long term (current) drug therapy: Secondary | ICD-10-CM | POA: Diagnosis not present

## 2019-04-23 DIAGNOSIS — G40509 Epileptic seizures related to external causes, not intractable, without status epilepticus: Secondary | ICD-10-CM | POA: Diagnosis not present

## 2019-04-23 DIAGNOSIS — R519 Headache, unspecified: Secondary | ICD-10-CM | POA: Diagnosis not present

## 2019-04-23 DIAGNOSIS — R252 Cramp and spasm: Secondary | ICD-10-CM | POA: Diagnosis not present

## 2019-07-30 DIAGNOSIS — N138 Other obstructive and reflux uropathy: Secondary | ICD-10-CM | POA: Diagnosis not present

## 2019-07-30 DIAGNOSIS — N401 Enlarged prostate with lower urinary tract symptoms: Secondary | ICD-10-CM | POA: Diagnosis not present

## 2019-07-30 DIAGNOSIS — N400 Enlarged prostate without lower urinary tract symptoms: Secondary | ICD-10-CM | POA: Diagnosis not present

## 2019-07-30 DIAGNOSIS — N410 Acute prostatitis: Secondary | ICD-10-CM | POA: Diagnosis not present

## 2019-08-20 DIAGNOSIS — R601 Generalized edema: Secondary | ICD-10-CM | POA: Diagnosis not present

## 2019-08-20 DIAGNOSIS — I10 Essential (primary) hypertension: Secondary | ICD-10-CM | POA: Diagnosis not present

## 2019-08-27 DIAGNOSIS — R601 Generalized edema: Secondary | ICD-10-CM | POA: Diagnosis not present

## 2019-09-09 DIAGNOSIS — I5030 Unspecified diastolic (congestive) heart failure: Secondary | ICD-10-CM | POA: Diagnosis not present

## 2019-09-09 DIAGNOSIS — Z79899 Other long term (current) drug therapy: Secondary | ICD-10-CM | POA: Diagnosis not present

## 2019-09-09 DIAGNOSIS — M5030 Other cervical disc degeneration, unspecified cervical region: Secondary | ICD-10-CM | POA: Diagnosis not present

## 2019-09-22 DIAGNOSIS — N138 Other obstructive and reflux uropathy: Secondary | ICD-10-CM | POA: Diagnosis not present

## 2019-09-22 DIAGNOSIS — N400 Enlarged prostate without lower urinary tract symptoms: Secondary | ICD-10-CM | POA: Diagnosis not present

## 2019-09-22 DIAGNOSIS — Z01810 Encounter for preprocedural cardiovascular examination: Secondary | ICD-10-CM | POA: Diagnosis not present

## 2019-09-22 DIAGNOSIS — N410 Acute prostatitis: Secondary | ICD-10-CM | POA: Diagnosis not present

## 2019-09-22 DIAGNOSIS — N401 Enlarged prostate with lower urinary tract symptoms: Secondary | ICD-10-CM | POA: Diagnosis not present

## 2019-09-28 NOTE — Progress Notes (Signed)
Cardiology Office Note:    Date:  09/29/2019   ID:  Joshua Parker, DOB [birth year], MRN [id number]  PCP:  [name], MD  Cardiologist:  Norman Herrlich, MD   Referring MD: Shirlee Latch, NP  ASSESSMENT:    1. Hypertensive heart disease with heart failure (HCC)   2. Chronic kidney disease, unspecified CKD stage    PLAN:    In order of problems listed above:  1. I think in his case what he had was edema related to his calcium channel blocker as opposed to heart failure his blood pressure is overtreated I will have reduce his ARB by 50% discontinue his calcium channel blocker and the son to follow blood pressures and follow-up with his primary care physician.  Do not think he requires diuretic therapy 2. Stable will benefit from being off diuretic 3. CAD is a false diagnosis.  Next appointment as needed   Medication Adjustments/Labs and Tests Ordered: Current medicines are reviewed at length with the patient today.  Concerns regarding medicines are outlined above.  Orders Placed This Encounter  Procedures  . EKG 12-Lead   No orders of the defined types were placed in this encounter.    Chief Complaint  Patient presents with  . Follow-up  . Congestive Heart Failure    History of Present Illness:    Joshua Parker is a 79 y.o. male who is being seen today for the evaluation of heart failure at the request of Shirlee Latch, NP.  Recent labs performed PCP office 08/21/2019: BNP level is quite low 136 BMP creatinine 1.63 GFR 39 cc/min potassium 3.8 sodium 145.  He had an echocardiogram performed at [facility] 08/27/2019 showing an ejection fraction of 55 to 60% diastolic diastolic function was seen with impaired relaxation there is no notation of filling pressures were increased there was trace to mild aortic regurgitation otherwise normal.  He is taking a combination of ARB calcium channel blocker in a few months ago developed isolated swelling lower  extremities he took furosemide weight loss 5 pounds less edema but developed hypotension and lightheadedness.  Evaluation showed a normal preproBNP level and echocardiogram with more changes of aging with diastolic dysfunction rather than diastolic heart failure.  He has been off furosemide still a little lightheaded at home blood pressure runs in the range of 100 systolic.  He has no shortness of breath orthopnea chest pain palpitation or syncope.  His medical record says he is coronary artery disease the patient and his son have no knowledge of this they said the only cardiology interaction he had is when he is admitted to the hospital with seizure disorder was seen by cardiologist and told that everything was good.  He has no history of congenital rheumatic heart disease or atrial fibrillation. Past Medical History:  Diagnosis Date  . CAD (coronary artery disease)   . Cervical disc disease   . CKD (chronic kidney disease), stage III   . Gallstones   . Hypertension   . Hypothyroidism   . Seizure disorder (HCC)   . TIA (transient ischemic attack)     Past Surgical History:  Procedure Laterality Date  . BLADDER SURGERY    . CATARACT EXTRACTION    . CHOLECYSTECTOMY    . NECK SURGERY      Current Medications: Current Meds  Medication Sig  . acetaminophen (TYLENOL) 500 MG tablet Take 500 mg by mouth every 6 (six) hours as needed.  Marland Kitchen atorvastatin (LIPITOR) 40 MG  tablet Take 40 mg by mouth every other day.  . baclofen (LIORESAL) 10 MG tablet Take 10-20 mg by mouth at bedtime as needed.  . Cholecalciferol (VITAMIN D-3) 125 MCG (5000 UT) TABS Take 1 tablet by mouth daily.  . cyanocobalamin (,VITAMIN B-12,) 1000 MCG/ML injection Inject 1,000 mcg into the skin every 30 (thirty) days.  . DULoxetine (CYMBALTA) 30 MG capsule Take 30 mg by mouth daily.  . furosemide (LASIX) 40 MG tablet Take 20 mg by mouth daily as needed.  . levETIRAcetam (KEPPRA) 500 MG tablet Take 500 mg by mouth 2 (two) times  daily.  Marland Kitchen levothyroxine (SYNTHROID) 88 MCG tablet Take 88 mcg by mouth every morning.  . meclizine (ANTIVERT) 12.5 MG tablet Take 12.5 mg by mouth 3 (three) times daily as needed.  Marland Kitchen olmesartan (BENICAR) 40 MG tablet Take 20 mg by mouth daily.  . tamsulosin (FLOMAX) 0.4 MG CAPS capsule Take 0.4 mg by mouth 2 (two) times daily.  . traMADol (ULTRAM) 50 MG tablet Take 50 mg by mouth 2 (two) times daily as needed.  . [DISCONTINUED] amLODipine (NORVASC) 2.5 MG tablet Take 2.5 mg by mouth every other day.     Allergies:   Gabapentin, Codeine, Iron, Topiramate, and Valproic acid   Social History   Socioeconomic History  . Marital status: Widowed    Spouse name: Not on file  . Number of children: Not on file  . Years of education: Not on file  . Highest education level: Not on file  Occupational History  . Not on file  Tobacco Use  . Smoking status: Former Games developer  . Smokeless tobacco: Current User    Types: Chew  Substance and Sexual Activity  . Alcohol use: Not on file  . Drug use: Not on file  . Sexual activity: Not on file  Other Topics Concern  . Not on file  Social History Narrative  . Not on file   Social Determinants of Health   Financial Resource Strain:   . Difficulty of Paying Living Expenses:   Food Insecurity:   . Worried About Programme researcher, broadcasting/film/video in the Last Year:   . Barista in the Last Year:   Transportation Needs:   . Freight forwarder (Medical):   Marland Kitchen Lack of Transportation (Non-Medical):   Physical Activity:   . Days of Exercise per Week:   . Minutes of Exercise per Session:   Stress:   . Feeling of Stress :   Social Connections:   . Frequency of Communication with Friends and Family:   . Frequency of Social Gatherings with Friends and Family:   . Attends Religious Services:   . Active Member of Clubs or Organizations:   . Attends Banker Meetings:   Marland Kitchen Marital Status:      Family History: The patient's family history is not  on file.  ROS:   Review of Systems  Constitutional: Negative.  HENT: Negative.   Eyes: Negative.   Cardiovascular: Positive for leg swelling.  Respiratory: Negative.   Endocrine: Negative.   Hematologic/Lymphatic: Negative.   Skin: Negative.   Musculoskeletal: Negative.   Gastrointestinal: Negative.   Genitourinary: Negative.   Neurological: Positive for seizures.  Psychiatric/Behavioral: Negative.   Allergic/Immunologic: Negative.    Please see the history of present illness.     All other systems reviewed and are negative.  EKGs/Labs/Other Studies Reviewed:    The following studies were reviewed today:   EKG:  EKG is  ordered today.  The ekg ordered today is personally reviewed and demonstrates sinus rhythm and is normal    Physical Exam:    VS:  BP 114/68 (BP Location: Left Arm, Patient Position: Sitting)   Pulse 54   Ht 5\' 8"  (1.727 m)   Wt 150 lb (68 kg)   SpO2 98%   BMI 22.81 kg/m     Wt Readings from Last 3 Encounters:  09/29/19 150 lb (68 kg)     GEN:  Well nourished, well developed in no acute distress HEENT: Normal NECK: No JVD; No carotid bruits LYMPHATICS: No lymphadenopathy CARDIAC: RRR, no murmurs, rubs, gallops RESPIRATORY:  Clear to auscultation without rales, wheezing or rhonchi  ABDOMEN: Soft, non-tender, non-distended MUSCULOSKELETAL:  No edema; No deformity  SKIN: Warm and dry NEUROLOGIC:  Alert and oriented x 3 PSYCHIATRIC:  Normal affect     Signed, 10/01/19, MD  09/29/2019 11:24 AM    [facility]

## 2019-09-29 ENCOUNTER — Encounter: Payer: Self-pay | Admitting: Cardiology

## 2019-09-29 ENCOUNTER — Ambulatory Visit (INDEPENDENT_AMBULATORY_CARE_PROVIDER_SITE_OTHER): Payer: Medicare Other | Admitting: Cardiology

## 2019-09-29 ENCOUNTER — Other Ambulatory Visit: Payer: Self-pay

## 2019-09-29 VITALS — BP 114/68 | HR 54 | Ht 68.0 in | Wt 150.0 lb

## 2019-09-29 DIAGNOSIS — E039 Hypothyroidism, unspecified: Secondary | ICD-10-CM | POA: Insufficient documentation

## 2019-09-29 DIAGNOSIS — I10 Essential (primary) hypertension: Secondary | ICD-10-CM | POA: Insufficient documentation

## 2019-09-29 DIAGNOSIS — N189 Chronic kidney disease, unspecified: Secondary | ICD-10-CM

## 2019-09-29 DIAGNOSIS — I2510 Atherosclerotic heart disease of native coronary artery without angina pectoris: Secondary | ICD-10-CM | POA: Insufficient documentation

## 2019-09-29 DIAGNOSIS — G40909 Epilepsy, unspecified, not intractable, without status epilepticus: Secondary | ICD-10-CM | POA: Insufficient documentation

## 2019-09-29 DIAGNOSIS — K8020 Calculus of gallbladder without cholecystitis without obstruction: Secondary | ICD-10-CM | POA: Insufficient documentation

## 2019-09-29 DIAGNOSIS — M5090 Cervical disc disorder, unspecified, unspecified cervical region: Secondary | ICD-10-CM | POA: Insufficient documentation

## 2019-09-29 DIAGNOSIS — I110 Hypertensive heart disease with heart failure: Secondary | ICD-10-CM | POA: Diagnosis not present

## 2019-09-29 DIAGNOSIS — G459 Transient cerebral ischemic attack, unspecified: Secondary | ICD-10-CM | POA: Insufficient documentation

## 2019-09-29 NOTE — Patient Instructions (Signed)
Medication Instructions:  Your physician has recommended you make the following change in your medication:  STOP: Amlodipine DECREASE: Olmesartan 20 mg take 0.5 tablet daily.  *If you need a refill on your cardiac medications before your next appointment, please call your pharmacy*   Lab Work: None If you have labs (blood work) drawn today and your tests are completely normal, you will receive your results only by: Marland Kitchen MyChart Message (if you have MyChart) OR . A paper copy in the mail If you have any lab test that is abnormal or we need to change your treatment, we will call you to review the results.   Testing/Procedures: None   Follow-Up: At [facility], you and your health needs are our priority.  As part of our continuing mission to provide you with exceptional heart care, we have created designated Provider Care Teams.  These Care Teams include your primary Cardiologist (physician) and Advanced Practice Providers (APPs -  Physician Assistants and Nurse Practitioners) who all work together to provide you with the care you need, when you need it.  We recommend signing up for the patient portal called "MyChart".  Sign up information is provided on this After Visit Summary.  MyChart is used to connect with patients for Virtual Visits (Telemedicine).  Patients are able to view lab/test results, encounter notes, upcoming appointments, etc.  Non-urgent messages can be sent to your provider as well.   To learn more about what you can do with MyChart, go to [email or web address].    Your next appointment:   As needed  The format for your next appointment:   In Person  Provider:   Norman Herrlich, MD   Other Instructions Please take your blood pressure daily at home.

## 2019-10-17 DIAGNOSIS — N138 Other obstructive and reflux uropathy: Secondary | ICD-10-CM | POA: Diagnosis not present

## 2019-10-17 DIAGNOSIS — I10 Essential (primary) hypertension: Secondary | ICD-10-CM | POA: Diagnosis not present

## 2019-10-17 DIAGNOSIS — Z87891 Personal history of nicotine dependence: Secondary | ICD-10-CM | POA: Diagnosis not present

## 2019-10-17 DIAGNOSIS — R569 Unspecified convulsions: Secondary | ICD-10-CM | POA: Diagnosis not present

## 2019-10-17 DIAGNOSIS — E7800 Pure hypercholesterolemia, unspecified: Secondary | ICD-10-CM | POA: Diagnosis not present

## 2019-10-17 DIAGNOSIS — N139 Obstructive and reflux uropathy, unspecified: Secondary | ICD-10-CM | POA: Diagnosis not present

## 2019-10-17 DIAGNOSIS — Z8673 Personal history of transient ischemic attack (TIA), and cerebral infarction without residual deficits: Secondary | ICD-10-CM | POA: Diagnosis not present

## 2019-10-17 DIAGNOSIS — Z79899 Other long term (current) drug therapy: Secondary | ICD-10-CM | POA: Diagnosis not present

## 2019-10-17 DIAGNOSIS — N401 Enlarged prostate with lower urinary tract symptoms: Secondary | ICD-10-CM | POA: Diagnosis not present

## 2019-10-17 DIAGNOSIS — E039 Hypothyroidism, unspecified: Secondary | ICD-10-CM | POA: Diagnosis not present

## 2019-10-20 DIAGNOSIS — N138 Other obstructive and reflux uropathy: Secondary | ICD-10-CM | POA: Diagnosis not present

## 2019-10-20 DIAGNOSIS — N400 Enlarged prostate without lower urinary tract symptoms: Secondary | ICD-10-CM | POA: Diagnosis not present

## 2019-10-20 DIAGNOSIS — N401 Enlarged prostate with lower urinary tract symptoms: Secondary | ICD-10-CM | POA: Diagnosis not present

## 2019-10-20 DIAGNOSIS — R338 Other retention of urine: Secondary | ICD-10-CM | POA: Diagnosis not present

## 2019-10-20 DIAGNOSIS — N9989 Other postprocedural complications and disorders of genitourinary system: Secondary | ICD-10-CM | POA: Diagnosis not present

## 2019-10-23 DIAGNOSIS — N9989 Other postprocedural complications and disorders of genitourinary system: Secondary | ICD-10-CM | POA: Diagnosis not present

## 2019-10-23 DIAGNOSIS — R338 Other retention of urine: Secondary | ICD-10-CM | POA: Diagnosis not present

## 2019-11-05 DIAGNOSIS — N9989 Other postprocedural complications and disorders of genitourinary system: Secondary | ICD-10-CM | POA: Diagnosis not present

## 2019-11-05 DIAGNOSIS — N401 Enlarged prostate with lower urinary tract symptoms: Secondary | ICD-10-CM | POA: Diagnosis not present

## 2019-11-05 DIAGNOSIS — R3915 Urgency of urination: Secondary | ICD-10-CM | POA: Diagnosis not present

## 2019-11-05 DIAGNOSIS — R338 Other retention of urine: Secondary | ICD-10-CM | POA: Diagnosis not present

## 2019-11-05 DIAGNOSIS — N138 Other obstructive and reflux uropathy: Secondary | ICD-10-CM | POA: Diagnosis not present

## 2019-11-05 DIAGNOSIS — F980 Enuresis not due to a substance or known physiological condition: Secondary | ICD-10-CM | POA: Diagnosis not present

## 2020-02-09 DIAGNOSIS — G319 Degenerative disease of nervous system, unspecified: Secondary | ICD-10-CM | POA: Diagnosis not present

## 2020-02-09 DIAGNOSIS — G9349 Other encephalopathy: Secondary | ICD-10-CM | POA: Diagnosis not present

## 2020-02-09 DIAGNOSIS — R569 Unspecified convulsions: Secondary | ICD-10-CM | POA: Diagnosis not present

## 2020-02-09 DIAGNOSIS — R4182 Altered mental status, unspecified: Secondary | ICD-10-CM | POA: Diagnosis not present

## 2020-02-09 DIAGNOSIS — G9340 Encephalopathy, unspecified: Secondary | ICD-10-CM | POA: Diagnosis not present

## 2020-02-09 DIAGNOSIS — E785 Hyperlipidemia, unspecified: Secondary | ICD-10-CM | POA: Diagnosis not present

## 2020-02-09 DIAGNOSIS — E860 Dehydration: Secondary | ICD-10-CM | POA: Diagnosis not present

## 2020-02-09 DIAGNOSIS — R001 Bradycardia, unspecified: Secondary | ICD-10-CM | POA: Diagnosis not present

## 2020-02-09 DIAGNOSIS — N179 Acute kidney failure, unspecified: Secondary | ICD-10-CM | POA: Diagnosis not present

## 2020-02-09 DIAGNOSIS — G40509 Epileptic seizures related to external causes, not intractable, without status epilepticus: Secondary | ICD-10-CM | POA: Diagnosis not present

## 2020-02-09 DIAGNOSIS — G9389 Other specified disorders of brain: Secondary | ICD-10-CM | POA: Diagnosis not present

## 2020-02-09 DIAGNOSIS — E039 Hypothyroidism, unspecified: Secondary | ICD-10-CM | POA: Diagnosis not present

## 2020-02-10 DIAGNOSIS — R4182 Altered mental status, unspecified: Secondary | ICD-10-CM | POA: Diagnosis not present

## 2020-02-10 DIAGNOSIS — I351 Nonrheumatic aortic (valve) insufficiency: Secondary | ICD-10-CM | POA: Diagnosis not present

## 2020-02-10 DIAGNOSIS — E785 Hyperlipidemia, unspecified: Secondary | ICD-10-CM | POA: Diagnosis not present

## 2020-02-10 DIAGNOSIS — S069X9S Unspecified intracranial injury with loss of consciousness of unspecified duration, sequela: Secondary | ICD-10-CM | POA: Diagnosis not present

## 2020-02-10 DIAGNOSIS — J320 Chronic maxillary sinusitis: Secondary | ICD-10-CM | POA: Diagnosis present

## 2020-02-10 DIAGNOSIS — Z79890 Hormone replacement therapy (postmenopausal): Secondary | ICD-10-CM | POA: Diagnosis not present

## 2020-02-10 DIAGNOSIS — H3530 Unspecified macular degeneration: Secondary | ICD-10-CM | POA: Diagnosis present

## 2020-02-10 DIAGNOSIS — E860 Dehydration: Secondary | ICD-10-CM | POA: Diagnosis present

## 2020-02-10 DIAGNOSIS — R4588 Nonsuicidal self-harm: Secondary | ICD-10-CM | POA: Diagnosis not present

## 2020-02-10 DIAGNOSIS — E039 Hypothyroidism, unspecified: Secondary | ICD-10-CM | POA: Diagnosis not present

## 2020-02-10 DIAGNOSIS — N183 Chronic kidney disease, stage 3 unspecified: Secondary | ICD-10-CM | POA: Diagnosis present

## 2020-02-10 DIAGNOSIS — Z79899 Other long term (current) drug therapy: Secondary | ICD-10-CM | POA: Diagnosis not present

## 2020-02-10 DIAGNOSIS — N400 Enlarged prostate without lower urinary tract symptoms: Secondary | ICD-10-CM | POA: Diagnosis present

## 2020-02-10 DIAGNOSIS — Z87891 Personal history of nicotine dependence: Secondary | ICD-10-CM | POA: Diagnosis not present

## 2020-02-10 DIAGNOSIS — G40509 Epileptic seizures related to external causes, not intractable, without status epilepticus: Secondary | ICD-10-CM | POA: Diagnosis present

## 2020-02-10 DIAGNOSIS — D72829 Elevated white blood cell count, unspecified: Secondary | ICD-10-CM | POA: Diagnosis not present

## 2020-02-10 DIAGNOSIS — Z8673 Personal history of transient ischemic attack (TIA), and cerebral infarction without residual deficits: Secondary | ICD-10-CM | POA: Diagnosis not present

## 2020-02-10 DIAGNOSIS — G9349 Other encephalopathy: Secondary | ICD-10-CM | POA: Diagnosis present

## 2020-02-10 DIAGNOSIS — S0012XA Contusion of left eyelid and periocular area, initial encounter: Secondary | ICD-10-CM | POA: Diagnosis not present

## 2020-02-10 DIAGNOSIS — R220 Localized swelling, mass and lump, head: Secondary | ICD-10-CM | POA: Diagnosis not present

## 2020-02-10 DIAGNOSIS — N179 Acute kidney failure, unspecified: Secondary | ICD-10-CM | POA: Diagnosis not present

## 2020-02-10 DIAGNOSIS — I517 Cardiomegaly: Secondary | ICD-10-CM | POA: Diagnosis not present

## 2020-02-10 DIAGNOSIS — Z20822 Contact with and (suspected) exposure to covid-19: Secondary | ICD-10-CM | POA: Diagnosis present

## 2020-02-10 DIAGNOSIS — R569 Unspecified convulsions: Secondary | ICD-10-CM | POA: Diagnosis not present

## 2020-02-11 DIAGNOSIS — R569 Unspecified convulsions: Secondary | ICD-10-CM | POA: Diagnosis not present

## 2020-02-11 DIAGNOSIS — E039 Hypothyroidism, unspecified: Secondary | ICD-10-CM | POA: Diagnosis not present

## 2020-02-11 DIAGNOSIS — E785 Hyperlipidemia, unspecified: Secondary | ICD-10-CM | POA: Diagnosis not present

## 2020-02-11 DIAGNOSIS — I517 Cardiomegaly: Secondary | ICD-10-CM | POA: Diagnosis not present

## 2020-02-11 DIAGNOSIS — I351 Nonrheumatic aortic (valve) insufficiency: Secondary | ICD-10-CM | POA: Diagnosis not present

## 2020-02-11 DIAGNOSIS — R4182 Altered mental status, unspecified: Secondary | ICD-10-CM | POA: Diagnosis not present

## 2020-02-11 DIAGNOSIS — N179 Acute kidney failure, unspecified: Secondary | ICD-10-CM | POA: Diagnosis not present

## 2020-02-12 DIAGNOSIS — R4182 Altered mental status, unspecified: Secondary | ICD-10-CM | POA: Diagnosis not present

## 2020-02-12 DIAGNOSIS — E039 Hypothyroidism, unspecified: Secondary | ICD-10-CM | POA: Diagnosis not present

## 2020-02-12 DIAGNOSIS — D72829 Elevated white blood cell count, unspecified: Secondary | ICD-10-CM | POA: Diagnosis not present

## 2020-02-12 DIAGNOSIS — E785 Hyperlipidemia, unspecified: Secondary | ICD-10-CM | POA: Diagnosis not present

## 2020-02-12 DIAGNOSIS — R569 Unspecified convulsions: Secondary | ICD-10-CM | POA: Diagnosis not present

## 2020-02-12 DIAGNOSIS — I517 Cardiomegaly: Secondary | ICD-10-CM | POA: Diagnosis not present

## 2020-02-12 DIAGNOSIS — N179 Acute kidney failure, unspecified: Secondary | ICD-10-CM | POA: Diagnosis not present

## 2020-02-13 DIAGNOSIS — R569 Unspecified convulsions: Secondary | ICD-10-CM | POA: Diagnosis not present

## 2020-02-13 DIAGNOSIS — N179 Acute kidney failure, unspecified: Secondary | ICD-10-CM | POA: Diagnosis not present

## 2020-02-13 DIAGNOSIS — R4182 Altered mental status, unspecified: Secondary | ICD-10-CM | POA: Diagnosis not present

## 2020-02-13 DIAGNOSIS — E785 Hyperlipidemia, unspecified: Secondary | ICD-10-CM | POA: Diagnosis not present

## 2020-02-13 DIAGNOSIS — E039 Hypothyroidism, unspecified: Secondary | ICD-10-CM | POA: Diagnosis not present

## 2020-02-14 DIAGNOSIS — E785 Hyperlipidemia, unspecified: Secondary | ICD-10-CM | POA: Diagnosis not present

## 2020-02-14 DIAGNOSIS — N179 Acute kidney failure, unspecified: Secondary | ICD-10-CM | POA: Diagnosis not present

## 2020-02-14 DIAGNOSIS — R4182 Altered mental status, unspecified: Secondary | ICD-10-CM | POA: Diagnosis not present

## 2020-02-14 DIAGNOSIS — E039 Hypothyroidism, unspecified: Secondary | ICD-10-CM | POA: Diagnosis not present

## 2020-02-14 DIAGNOSIS — R569 Unspecified convulsions: Secondary | ICD-10-CM | POA: Diagnosis not present

## 2020-02-15 DIAGNOSIS — R569 Unspecified convulsions: Secondary | ICD-10-CM | POA: Diagnosis not present

## 2020-02-15 DIAGNOSIS — R4182 Altered mental status, unspecified: Secondary | ICD-10-CM | POA: Diagnosis not present

## 2020-02-15 DIAGNOSIS — E039 Hypothyroidism, unspecified: Secondary | ICD-10-CM | POA: Diagnosis not present

## 2020-02-15 DIAGNOSIS — E785 Hyperlipidemia, unspecified: Secondary | ICD-10-CM | POA: Diagnosis not present

## 2020-02-15 DIAGNOSIS — N179 Acute kidney failure, unspecified: Secondary | ICD-10-CM | POA: Diagnosis not present

## 2020-02-16 DIAGNOSIS — R4182 Altered mental status, unspecified: Secondary | ICD-10-CM | POA: Diagnosis not present

## 2020-02-16 DIAGNOSIS — N179 Acute kidney failure, unspecified: Secondary | ICD-10-CM | POA: Diagnosis not present

## 2020-02-16 DIAGNOSIS — R569 Unspecified convulsions: Secondary | ICD-10-CM | POA: Diagnosis not present

## 2020-02-16 DIAGNOSIS — E039 Hypothyroidism, unspecified: Secondary | ICD-10-CM | POA: Diagnosis not present

## 2020-02-16 DIAGNOSIS — E785 Hyperlipidemia, unspecified: Secondary | ICD-10-CM | POA: Diagnosis not present

## 2020-02-19 DIAGNOSIS — G40509 Epileptic seizures related to external causes, not intractable, without status epilepticus: Secondary | ICD-10-CM | POA: Diagnosis not present

## 2020-02-19 DIAGNOSIS — J00 Acute nasopharyngitis [common cold]: Secondary | ICD-10-CM | POA: Diagnosis not present

## 2020-03-09 DIAGNOSIS — R059 Cough, unspecified: Secondary | ICD-10-CM | POA: Diagnosis not present

## 2020-03-09 DIAGNOSIS — Z20822 Contact with and (suspected) exposure to covid-19: Secondary | ICD-10-CM | POA: Diagnosis not present

## 2020-03-09 DIAGNOSIS — J019 Acute sinusitis, unspecified: Secondary | ICD-10-CM | POA: Diagnosis not present

## 2020-03-16 DIAGNOSIS — I10 Essential (primary) hypertension: Secondary | ICD-10-CM | POA: Diagnosis not present

## 2020-05-18 DIAGNOSIS — R32 Unspecified urinary incontinence: Secondary | ICD-10-CM | POA: Diagnosis not present

## 2020-05-18 DIAGNOSIS — N400 Enlarged prostate without lower urinary tract symptoms: Secondary | ICD-10-CM | POA: Diagnosis not present

## 2020-05-18 DIAGNOSIS — N5319 Other ejaculatory dysfunction: Secondary | ICD-10-CM | POA: Diagnosis not present

## 2020-05-18 DIAGNOSIS — N138 Other obstructive and reflux uropathy: Secondary | ICD-10-CM | POA: Diagnosis not present

## 2020-05-18 DIAGNOSIS — N410 Acute prostatitis: Secondary | ICD-10-CM | POA: Diagnosis not present

## 2020-05-18 DIAGNOSIS — N401 Enlarged prostate with lower urinary tract symptoms: Secondary | ICD-10-CM | POA: Diagnosis not present

## 2020-05-19 DIAGNOSIS — Z6821 Body mass index (BMI) 21.0-21.9, adult: Secondary | ICD-10-CM | POA: Diagnosis not present

## 2020-05-19 DIAGNOSIS — I10 Essential (primary) hypertension: Secondary | ICD-10-CM | POA: Diagnosis not present

## 2020-05-19 DIAGNOSIS — E785 Hyperlipidemia, unspecified: Secondary | ICD-10-CM | POA: Diagnosis not present

## 2020-05-19 DIAGNOSIS — Z79899 Other long term (current) drug therapy: Secondary | ICD-10-CM | POA: Diagnosis not present

## 2020-05-19 DIAGNOSIS — R519 Headache, unspecified: Secondary | ICD-10-CM | POA: Diagnosis not present

## 2020-05-19 DIAGNOSIS — E039 Hypothyroidism, unspecified: Secondary | ICD-10-CM | POA: Diagnosis not present

## 2020-07-15 DIAGNOSIS — E039 Hypothyroidism, unspecified: Secondary | ICD-10-CM | POA: Diagnosis not present

## 2020-07-15 DIAGNOSIS — E785 Hyperlipidemia, unspecified: Secondary | ICD-10-CM | POA: Diagnosis not present

## 2020-07-15 DIAGNOSIS — Z6821 Body mass index (BMI) 21.0-21.9, adult: Secondary | ICD-10-CM | POA: Diagnosis not present

## 2020-07-15 DIAGNOSIS — I10 Essential (primary) hypertension: Secondary | ICD-10-CM | POA: Diagnosis not present

## 2020-07-15 DIAGNOSIS — Z79899 Other long term (current) drug therapy: Secondary | ICD-10-CM | POA: Diagnosis not present

## 2020-08-03 DIAGNOSIS — E538 Deficiency of other specified B group vitamins: Secondary | ICD-10-CM | POA: Diagnosis not present

## 2020-08-03 DIAGNOSIS — D649 Anemia, unspecified: Secondary | ICD-10-CM | POA: Diagnosis not present

## 2020-09-02 DIAGNOSIS — D649 Anemia, unspecified: Secondary | ICD-10-CM | POA: Diagnosis not present

## 2020-09-09 DIAGNOSIS — G43909 Migraine, unspecified, not intractable, without status migrainosus: Secondary | ICD-10-CM | POA: Diagnosis not present

## 2020-09-09 DIAGNOSIS — R519 Headache, unspecified: Secondary | ICD-10-CM | POA: Diagnosis not present

## 2020-09-15 DIAGNOSIS — I6782 Cerebral ischemia: Secondary | ICD-10-CM | POA: Diagnosis not present

## 2020-09-15 DIAGNOSIS — G9389 Other specified disorders of brain: Secondary | ICD-10-CM | POA: Diagnosis not present

## 2020-09-15 DIAGNOSIS — G319 Degenerative disease of nervous system, unspecified: Secondary | ICD-10-CM | POA: Diagnosis not present

## 2020-09-15 DIAGNOSIS — R519 Headache, unspecified: Secondary | ICD-10-CM | POA: Diagnosis not present

## 2020-10-11 DIAGNOSIS — Z7189 Other specified counseling: Secondary | ICD-10-CM | POA: Diagnosis not present

## 2020-10-11 DIAGNOSIS — I10 Essential (primary) hypertension: Secondary | ICD-10-CM | POA: Diagnosis not present

## 2020-10-11 DIAGNOSIS — E785 Hyperlipidemia, unspecified: Secondary | ICD-10-CM | POA: Diagnosis not present

## 2020-10-11 DIAGNOSIS — K5900 Constipation, unspecified: Secondary | ICD-10-CM | POA: Diagnosis not present

## 2020-10-11 DIAGNOSIS — D649 Anemia, unspecified: Secondary | ICD-10-CM | POA: Diagnosis not present

## 2020-10-11 DIAGNOSIS — E039 Hypothyroidism, unspecified: Secondary | ICD-10-CM | POA: Diagnosis not present

## 2020-10-11 DIAGNOSIS — Z1331 Encounter for screening for depression: Secondary | ICD-10-CM | POA: Diagnosis not present

## 2020-10-11 DIAGNOSIS — Z79899 Other long term (current) drug therapy: Secondary | ICD-10-CM | POA: Diagnosis not present

## 2020-10-11 DIAGNOSIS — R519 Headache, unspecified: Secondary | ICD-10-CM | POA: Diagnosis not present

## 2020-10-11 DIAGNOSIS — Z6822 Body mass index (BMI) 22.0-22.9, adult: Secondary | ICD-10-CM | POA: Diagnosis not present

## 2020-10-11 DIAGNOSIS — Z0000 Encounter for general adult medical examination without abnormal findings: Secondary | ICD-10-CM | POA: Diagnosis not present

## 2020-11-15 DIAGNOSIS — R5383 Other fatigue: Secondary | ICD-10-CM | POA: Diagnosis not present

## 2020-11-17 DIAGNOSIS — N401 Enlarged prostate with lower urinary tract symptoms: Secondary | ICD-10-CM | POA: Diagnosis not present

## 2020-11-17 DIAGNOSIS — N138 Other obstructive and reflux uropathy: Secondary | ICD-10-CM | POA: Diagnosis not present

## 2020-12-13 DIAGNOSIS — M5020 Other cervical disc displacement, unspecified cervical region: Secondary | ICD-10-CM | POA: Diagnosis not present

## 2020-12-13 DIAGNOSIS — M5412 Radiculopathy, cervical region: Secondary | ICD-10-CM | POA: Diagnosis not present

## 2020-12-13 DIAGNOSIS — I10 Essential (primary) hypertension: Secondary | ICD-10-CM | POA: Diagnosis not present

## 2020-12-13 DIAGNOSIS — M79602 Pain in left arm: Secondary | ICD-10-CM | POA: Diagnosis not present

## 2020-12-13 DIAGNOSIS — R200 Anesthesia of skin: Secondary | ICD-10-CM | POA: Diagnosis not present

## 2020-12-13 DIAGNOSIS — M79601 Pain in right arm: Secondary | ICD-10-CM | POA: Diagnosis not present

## 2020-12-13 DIAGNOSIS — G959 Disease of spinal cord, unspecified: Secondary | ICD-10-CM | POA: Diagnosis not present

## 2020-12-13 DIAGNOSIS — R202 Paresthesia of skin: Secondary | ICD-10-CM | POA: Diagnosis not present

## 2020-12-13 DIAGNOSIS — M4802 Spinal stenosis, cervical region: Secondary | ICD-10-CM | POA: Diagnosis not present

## 2020-12-15 DIAGNOSIS — G43909 Migraine, unspecified, not intractable, without status migrainosus: Secondary | ICD-10-CM | POA: Diagnosis not present

## 2020-12-30 ENCOUNTER — Other Ambulatory Visit: Payer: Self-pay | Admitting: Neurological Surgery

## 2021-01-06 ENCOUNTER — Other Ambulatory Visit (HOSPITAL_COMMUNITY): Payer: Self-pay | Admitting: Neurological Surgery

## 2021-01-06 DIAGNOSIS — G959 Disease of spinal cord, unspecified: Secondary | ICD-10-CM

## 2021-01-09 ENCOUNTER — Ambulatory Visit (HOSPITAL_COMMUNITY)
Admission: RE | Admit: 2021-01-09 | Discharge: 2021-01-09 | Disposition: A | Discharge status: Home / Self Care | Payer: Medicare Other | Source: Ambulatory Visit | Attending: Neurological Surgery | Admitting: Neurological Surgery

## 2021-01-09 DIAGNOSIS — M4803 Spinal stenosis, cervicothoracic region: Secondary | ICD-10-CM | POA: Diagnosis not present

## 2021-01-09 DIAGNOSIS — M47812 Spondylosis without myelopathy or radiculopathy, cervical region: Secondary | ICD-10-CM | POA: Diagnosis not present

## 2021-01-09 DIAGNOSIS — G959 Disease of spinal cord, unspecified: Secondary | ICD-10-CM | POA: Insufficient documentation

## 2021-01-09 DIAGNOSIS — Z8669 Personal history of other diseases of the nervous system and sense organs: Secondary | ICD-10-CM | POA: Diagnosis not present

## 2021-01-09 DIAGNOSIS — M4802 Spinal stenosis, cervical region: Secondary | ICD-10-CM | POA: Diagnosis not present

## 2021-01-13 DIAGNOSIS — Z79899 Other long term (current) drug therapy: Secondary | ICD-10-CM | POA: Diagnosis not present

## 2021-01-13 DIAGNOSIS — Z6822 Body mass index (BMI) 22.0-22.9, adult: Secondary | ICD-10-CM | POA: Diagnosis not present

## 2021-01-13 DIAGNOSIS — I10 Essential (primary) hypertension: Secondary | ICD-10-CM | POA: Diagnosis not present

## 2021-01-13 DIAGNOSIS — E039 Hypothyroidism, unspecified: Secondary | ICD-10-CM | POA: Diagnosis not present

## 2021-01-13 DIAGNOSIS — E538 Deficiency of other specified B group vitamins: Secondary | ICD-10-CM | POA: Diagnosis not present

## 2021-01-19 DIAGNOSIS — G959 Disease of spinal cord, unspecified: Secondary | ICD-10-CM | POA: Diagnosis not present

## 2021-01-19 DIAGNOSIS — M5412 Radiculopathy, cervical region: Secondary | ICD-10-CM | POA: Diagnosis not present

## 2021-01-28 ENCOUNTER — Other Ambulatory Visit (HOSPITAL_COMMUNITY): Payer: Medicare Other

## 2021-02-01 ENCOUNTER — Inpatient Hospital Stay (HOSPITAL_COMMUNITY): Admission: RE | Admit: 2021-02-01 | Payer: Medicare Other | Source: Home / Self Care | Admitting: Neurological Surgery

## 2021-02-01 ENCOUNTER — Encounter (HOSPITAL_COMMUNITY): Admission: RE | Payer: Self-pay | Source: Home / Self Care

## 2021-02-01 SURGERY — ANTERIOR CERVICAL DECOMPRESSION/DISCECTOMY FUSION 2 LEVELS
Anesthesia: General

## 2021-02-03 DIAGNOSIS — E039 Hypothyroidism, unspecified: Secondary | ICD-10-CM | POA: Diagnosis not present

## 2021-03-21 DIAGNOSIS — N138 Other obstructive and reflux uropathy: Secondary | ICD-10-CM | POA: Diagnosis not present

## 2021-03-21 DIAGNOSIS — N400 Enlarged prostate without lower urinary tract symptoms: Secondary | ICD-10-CM | POA: Diagnosis not present

## 2021-03-21 DIAGNOSIS — N401 Enlarged prostate with lower urinary tract symptoms: Secondary | ICD-10-CM | POA: Diagnosis not present

## 2021-04-19 DIAGNOSIS — G40509 Epileptic seizures related to external causes, not intractable, without status epilepticus: Secondary | ICD-10-CM | POA: Diagnosis not present

## 2021-04-19 DIAGNOSIS — E039 Hypothyroidism, unspecified: Secondary | ICD-10-CM | POA: Diagnosis not present

## 2021-04-19 DIAGNOSIS — I10 Essential (primary) hypertension: Secondary | ICD-10-CM | POA: Diagnosis not present

## 2021-04-19 DIAGNOSIS — Z79899 Other long term (current) drug therapy: Secondary | ICD-10-CM | POA: Diagnosis not present

## 2021-04-19 DIAGNOSIS — E785 Hyperlipidemia, unspecified: Secondary | ICD-10-CM | POA: Diagnosis not present

## 2021-04-19 DIAGNOSIS — Z6822 Body mass index (BMI) 22.0-22.9, adult: Secondary | ICD-10-CM | POA: Diagnosis not present

## 2021-04-19 DIAGNOSIS — R1310 Dysphagia, unspecified: Secondary | ICD-10-CM | POA: Diagnosis not present

## 2021-04-19 DIAGNOSIS — I2510 Atherosclerotic heart disease of native coronary artery without angina pectoris: Secondary | ICD-10-CM | POA: Diagnosis not present

## 2021-04-27 DIAGNOSIS — R1310 Dysphagia, unspecified: Secondary | ICD-10-CM | POA: Diagnosis not present

## 2021-04-27 DIAGNOSIS — K224 Dyskinesia of esophagus: Secondary | ICD-10-CM | POA: Diagnosis not present

## 2021-04-27 DIAGNOSIS — K449 Diaphragmatic hernia without obstruction or gangrene: Secondary | ICD-10-CM | POA: Diagnosis not present

## 2021-05-23 DIAGNOSIS — R7989 Other specified abnormal findings of blood chemistry: Secondary | ICD-10-CM | POA: Diagnosis not present

## 2021-05-31 DIAGNOSIS — N411 Chronic prostatitis: Secondary | ICD-10-CM | POA: Diagnosis not present

## 2021-05-31 DIAGNOSIS — N401 Enlarged prostate with lower urinary tract symptoms: Secondary | ICD-10-CM | POA: Diagnosis not present

## 2021-05-31 DIAGNOSIS — N400 Enlarged prostate without lower urinary tract symptoms: Secondary | ICD-10-CM | POA: Diagnosis not present

## 2021-05-31 DIAGNOSIS — R102 Pelvic and perineal pain: Secondary | ICD-10-CM | POA: Diagnosis not present

## 2021-05-31 DIAGNOSIS — R198 Other specified symptoms and signs involving the digestive system and abdomen: Secondary | ICD-10-CM | POA: Diagnosis not present

## 2021-05-31 DIAGNOSIS — N410 Acute prostatitis: Secondary | ICD-10-CM | POA: Diagnosis not present

## 2021-05-31 DIAGNOSIS — R3915 Urgency of urination: Secondary | ICD-10-CM | POA: Diagnosis not present

## 2021-05-31 DIAGNOSIS — N138 Other obstructive and reflux uropathy: Secondary | ICD-10-CM | POA: Diagnosis not present

## 2021-06-08 DIAGNOSIS — G43909 Migraine, unspecified, not intractable, without status migrainosus: Secondary | ICD-10-CM | POA: Diagnosis not present

## 2021-06-14 DIAGNOSIS — R198 Other specified symptoms and signs involving the digestive system and abdomen: Secondary | ICD-10-CM | POA: Diagnosis not present

## 2021-06-14 DIAGNOSIS — N411 Chronic prostatitis: Secondary | ICD-10-CM | POA: Diagnosis not present

## 2021-06-14 DIAGNOSIS — N410 Acute prostatitis: Secondary | ICD-10-CM | POA: Diagnosis not present

## 2021-06-14 DIAGNOSIS — M545 Low back pain, unspecified: Secondary | ICD-10-CM | POA: Diagnosis not present

## 2021-06-14 DIAGNOSIS — R634 Abnormal weight loss: Secondary | ICD-10-CM | POA: Diagnosis not present

## 2021-06-14 DIAGNOSIS — N400 Enlarged prostate without lower urinary tract symptoms: Secondary | ICD-10-CM | POA: Diagnosis not present

## 2021-06-14 DIAGNOSIS — R252 Cramp and spasm: Secondary | ICD-10-CM | POA: Diagnosis not present

## 2021-06-20 DIAGNOSIS — N5319 Other ejaculatory dysfunction: Secondary | ICD-10-CM | POA: Diagnosis not present

## 2021-06-20 DIAGNOSIS — R3915 Urgency of urination: Secondary | ICD-10-CM | POA: Diagnosis not present

## 2021-06-20 DIAGNOSIS — N419 Inflammatory disease of prostate, unspecified: Secondary | ICD-10-CM | POA: Diagnosis not present

## 2021-06-20 DIAGNOSIS — N401 Enlarged prostate with lower urinary tract symptoms: Secondary | ICD-10-CM | POA: Diagnosis not present

## 2021-06-20 DIAGNOSIS — N138 Other obstructive and reflux uropathy: Secondary | ICD-10-CM | POA: Diagnosis not present

## 2021-06-20 DIAGNOSIS — R198 Other specified symptoms and signs involving the digestive system and abdomen: Secondary | ICD-10-CM | POA: Diagnosis not present

## 2021-07-05 DIAGNOSIS — R42 Dizziness and giddiness: Secondary | ICD-10-CM | POA: Diagnosis not present

## 2021-07-05 DIAGNOSIS — H903 Sensorineural hearing loss, bilateral: Secondary | ICD-10-CM | POA: Diagnosis not present

## 2021-07-05 DIAGNOSIS — H02052 Trichiasis without entropian right lower eyelid: Secondary | ICD-10-CM | POA: Diagnosis not present

## 2021-08-17 DIAGNOSIS — E039 Hypothyroidism, unspecified: Secondary | ICD-10-CM | POA: Diagnosis not present

## 2021-08-17 DIAGNOSIS — N1832 Chronic kidney disease, stage 3b: Secondary | ICD-10-CM | POA: Diagnosis not present

## 2021-08-17 DIAGNOSIS — Z79899 Other long term (current) drug therapy: Secondary | ICD-10-CM | POA: Diagnosis not present

## 2021-08-17 DIAGNOSIS — R32 Unspecified urinary incontinence: Secondary | ICD-10-CM | POA: Diagnosis not present

## 2021-08-17 DIAGNOSIS — E785 Hyperlipidemia, unspecified: Secondary | ICD-10-CM | POA: Diagnosis not present

## 2021-08-17 DIAGNOSIS — F5232 Male orgasmic disorder: Secondary | ICD-10-CM | POA: Diagnosis not present

## 2021-08-17 DIAGNOSIS — N138 Other obstructive and reflux uropathy: Secondary | ICD-10-CM | POA: Diagnosis not present

## 2021-08-17 DIAGNOSIS — I2510 Atherosclerotic heart disease of native coronary artery without angina pectoris: Secondary | ICD-10-CM | POA: Diagnosis not present

## 2021-08-17 DIAGNOSIS — Z6820 Body mass index (BMI) 20.0-20.9, adult: Secondary | ICD-10-CM | POA: Diagnosis not present

## 2021-08-17 DIAGNOSIS — I10 Essential (primary) hypertension: Secondary | ICD-10-CM | POA: Diagnosis not present

## 2021-08-17 DIAGNOSIS — R519 Headache, unspecified: Secondary | ICD-10-CM | POA: Diagnosis not present

## 2021-08-17 DIAGNOSIS — N401 Enlarged prostate with lower urinary tract symptoms: Secondary | ICD-10-CM | POA: Diagnosis not present

## 2021-09-27 DIAGNOSIS — D649 Anemia, unspecified: Secondary | ICD-10-CM | POA: Diagnosis not present

## 2021-11-10 DIAGNOSIS — H02052 Trichiasis without entropian right lower eyelid: Secondary | ICD-10-CM | POA: Diagnosis not present

## 2021-11-10 DIAGNOSIS — H353133 Nonexudative age-related macular degeneration, bilateral, advanced atrophic without subfoveal involvement: Secondary | ICD-10-CM | POA: Diagnosis not present

## 2021-11-11 DIAGNOSIS — D509 Iron deficiency anemia, unspecified: Secondary | ICD-10-CM | POA: Diagnosis not present

## 2021-11-18 DIAGNOSIS — D509 Iron deficiency anemia, unspecified: Secondary | ICD-10-CM | POA: Diagnosis not present

## 2021-12-12 DIAGNOSIS — R3915 Urgency of urination: Secondary | ICD-10-CM | POA: Diagnosis not present

## 2021-12-12 DIAGNOSIS — N138 Other obstructive and reflux uropathy: Secondary | ICD-10-CM | POA: Diagnosis not present

## 2021-12-12 DIAGNOSIS — R32 Unspecified urinary incontinence: Secondary | ICD-10-CM | POA: Diagnosis not present

## 2021-12-12 DIAGNOSIS — N401 Enlarged prostate with lower urinary tract symptoms: Secondary | ICD-10-CM | POA: Diagnosis not present

## 2021-12-30 IMAGING — MR MR CERVICAL SPINE W/O CM
4 of 6 series · 18 of 48 positions shown · non-contrast
Comparison: MRI of the cervical spine November 29, 2018

CLINICAL DATA: Cervical myelopathy.

EXAM:
MRI CERVICAL SPINE WITHOUT CONTRAST
TECHNIQUE: Multiplanar, multisequence MR imaging of the cervical spine was
performed. No intravenous contrast was administered.

[Series 5: T1 · sagittal · 3.0mm · 0.43mm/px · 3 of 21 slices shown (1 of 2)]
[im 1/21]
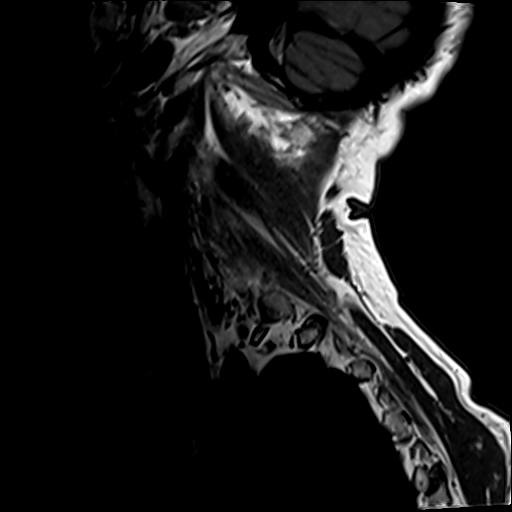
[im 11/21]
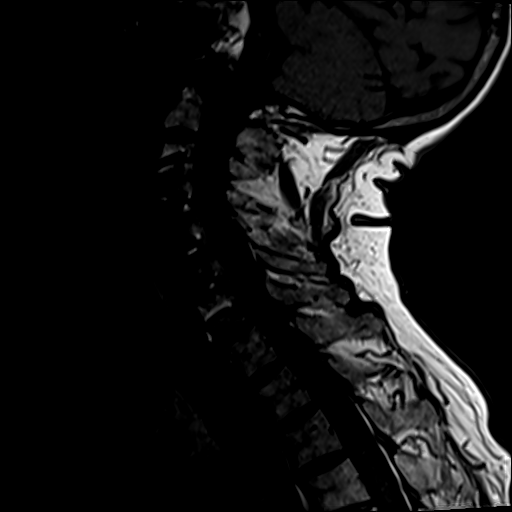
[im 21/21]
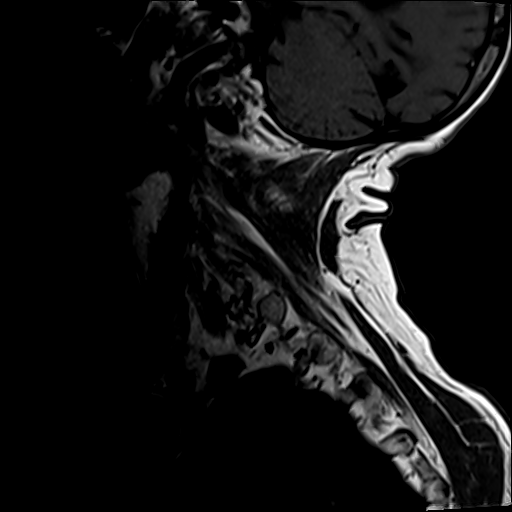

[Series 6: T2 · sagittal · 3.0mm · 0.43mm/px · 5 of 21 slices shown (1 of 2)]
[im 1/21]
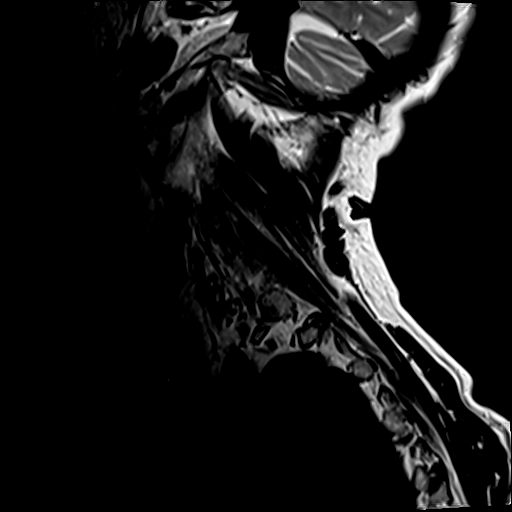
[im 6/21]
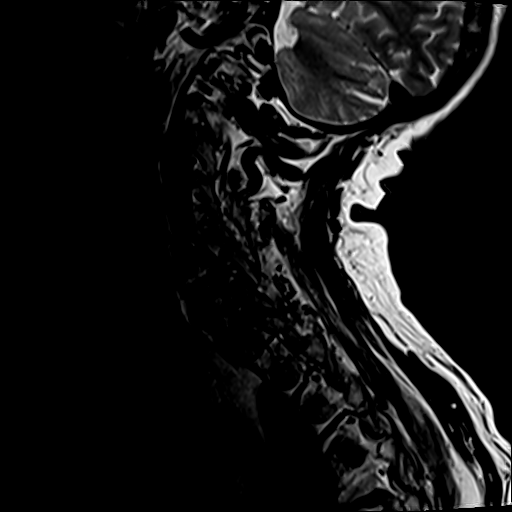
[im 11/21]
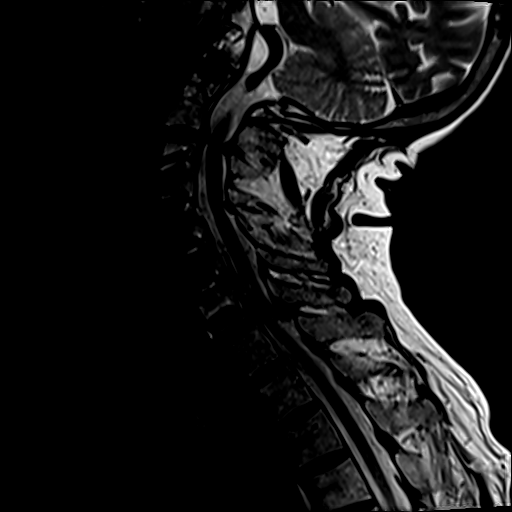
[im 16/21]
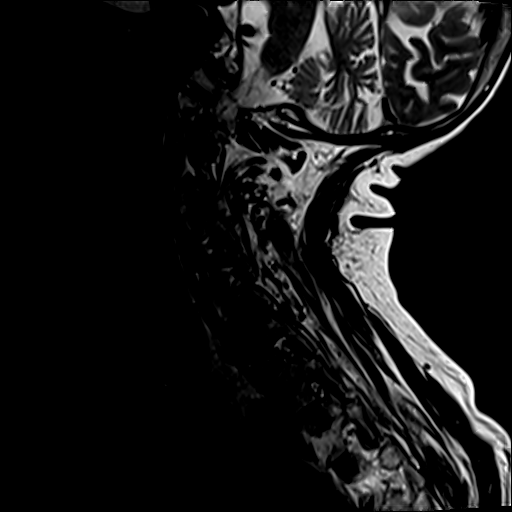
[im 21/21]
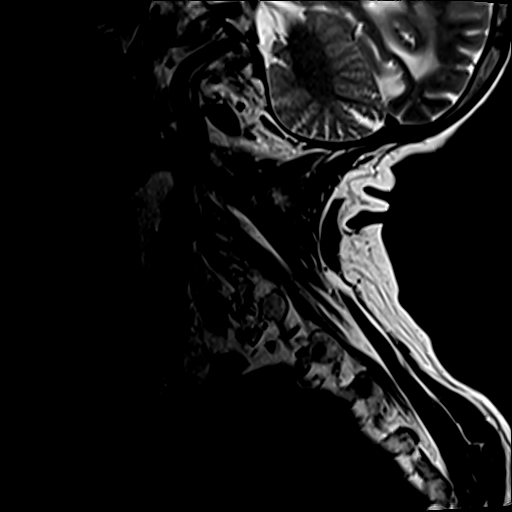

[Series 8: T2 · axial · 4.0mm · 0.35mm/px · z∈[-88,+42]mm · 7 of 38 slices shown (2 of 2)]
[im 1/38]
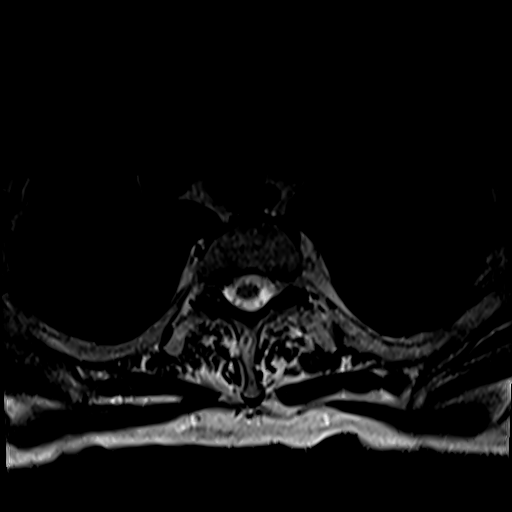
[im 5/38]
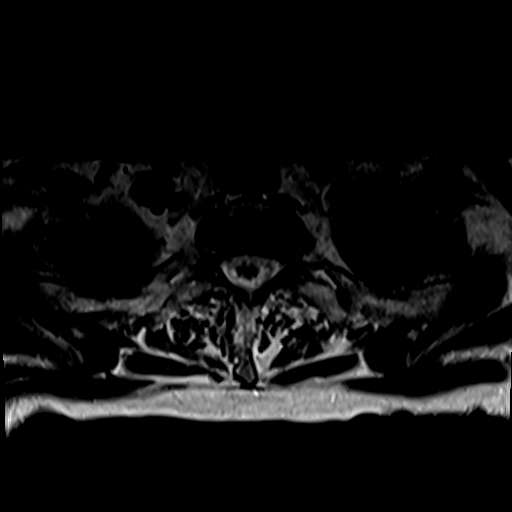
[im 13/38]
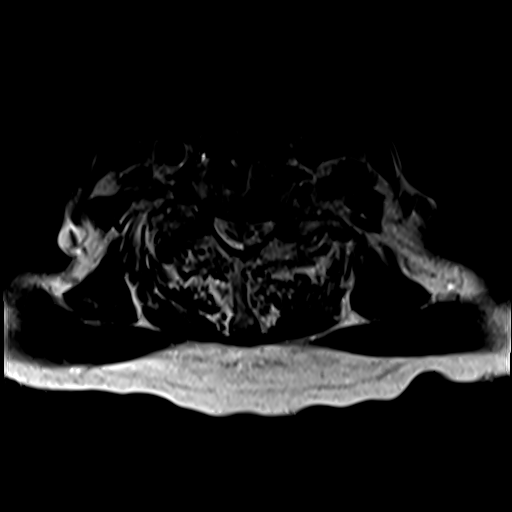
[im 17/38]
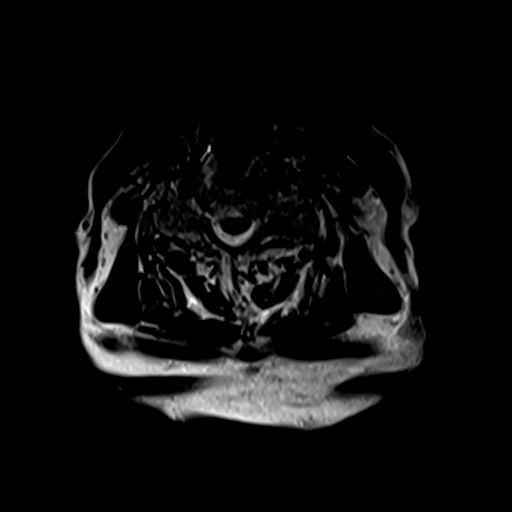
[im 21/38]
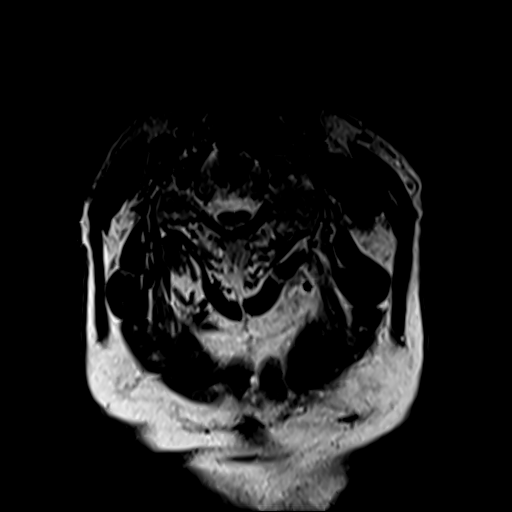
[im 25/38]
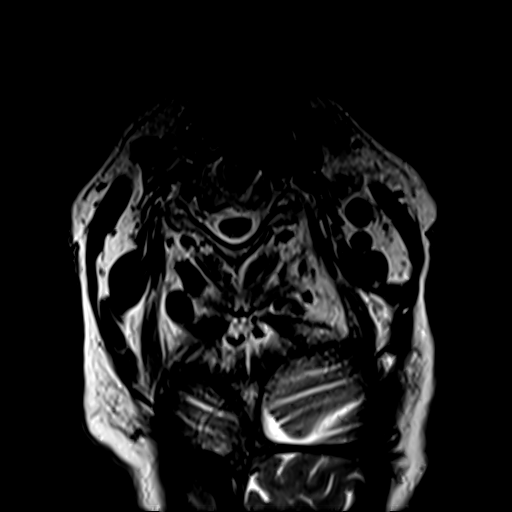
[im 33/38]
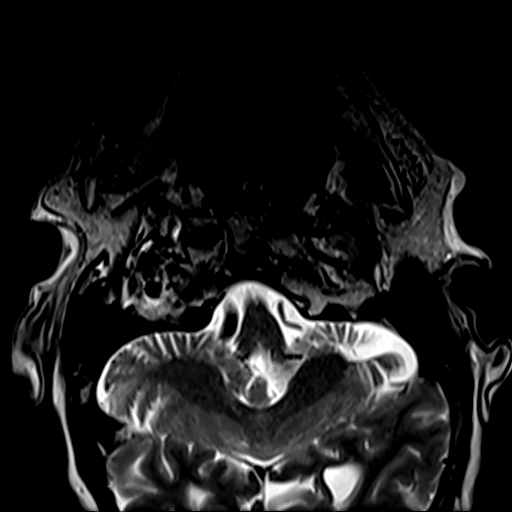

[Series 10: T1 · axial · 4.0mm · 0.39mm/px · z∈[-76,+38]mm · 3 of 38 slices shown (2 of 2)]
[im 5/38]
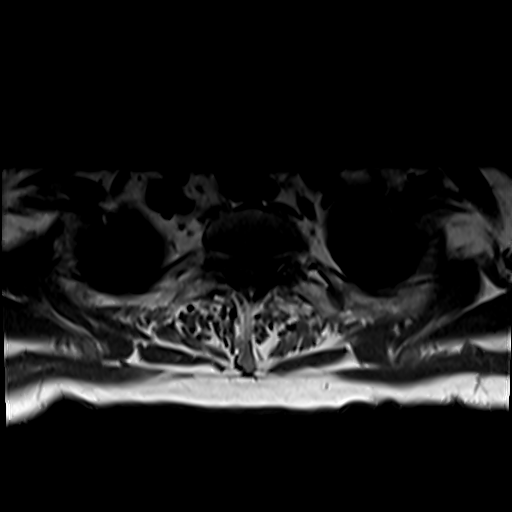
[im 21/38]
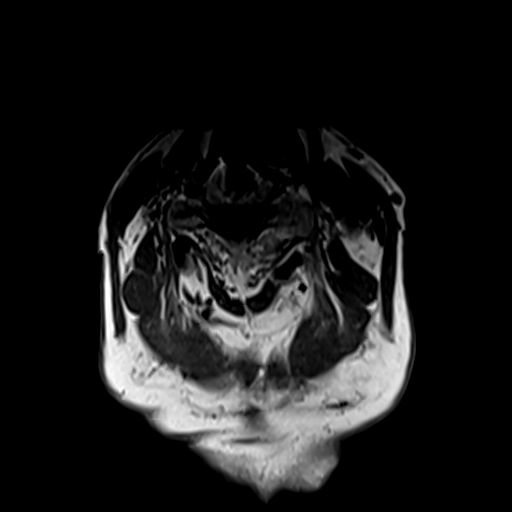
[im 33/38]
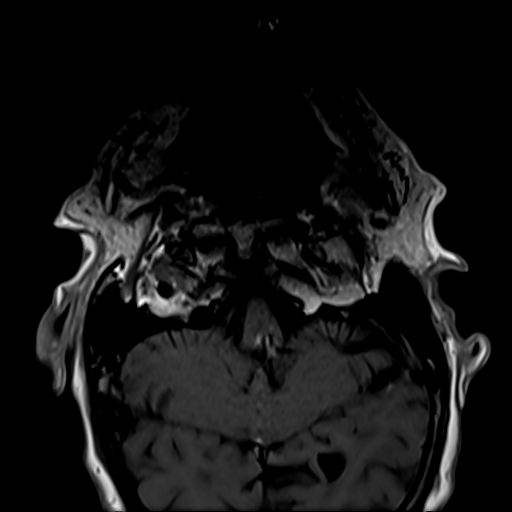

[18 of 48 positions shown; findings below may reference images not displayed]

FINDINGS: Alignment: Small anterolisthesis of C7 over T1.

Vertebrae: No fracture, evidence of discitis, or bone lesion. Status
post C3 through C7 ACDF. Facet fusion is noted from C4 through C6.

Cord: Again seen increased T2 hyperintensity within the cord with
decreased cord volume at the C6-7 level, consistent with
myelomalacia. No new focus of increased T2 signal identified.

Posterior Fossa, vertebral arteries, paraspinal tissues: Negative.

Disc levels:

C2-3: Posterior disc osteophyte complex and prominence of the
ligamentum flavum resulting in mild spinal canal stenosis. Facet
degenerative changes resulting in moderate left neural foraminal
narrowing.

C3-4: Uncinate spurring resulting in mild left neural foraminal
narrowing. No spinal canal stenosis.

C4-5: Uncovertebral and facet degenerative changes resulting in
moderate left neural foraminal narrowing. No spinal canal stenosis.

C5-6: Uncinate spurring bilateral facet hypertrophy resulting in
mild right and moderate left neural foraminal narrowing.

C6-7: Uncovertebral and facet degenerative changes resulting in
moderate bilateral neural foraminal narrowing. No spinal canal
stenosis.

C7-T1: Disc bulge and thickening of the ligamentum flavum resulting
in mild spinal canal stenosis. Uncovertebral and facet degenerative
changes resulting in moderate bilateral neural foraminal narrowing.
IMPRESSION: 1. Postsurgical changes from C3 through C7 spinal fusion.
2. Degenerative changes with of the cervical spine with mild spinal
canal stenosis at C2-3 and C7-T1.
3. Multilevel neural foraminal narrowing, as described above, mildly
progressed from prior MRI, moderate on the left at C2-3, C4-5, C5-6
and bilaterally at C6-7 and C7-T1.

## 2022-01-07 DIAGNOSIS — E7800 Pure hypercholesterolemia, unspecified: Secondary | ICD-10-CM | POA: Diagnosis not present

## 2022-01-07 DIAGNOSIS — I10 Essential (primary) hypertension: Secondary | ICD-10-CM | POA: Diagnosis not present

## 2022-01-07 DIAGNOSIS — Z0389 Encounter for observation for other suspected diseases and conditions ruled out: Secondary | ICD-10-CM | POA: Diagnosis not present

## 2022-01-07 DIAGNOSIS — Z8673 Personal history of transient ischemic attack (TIA), and cerebral infarction without residual deficits: Secondary | ICD-10-CM | POA: Diagnosis not present

## 2022-01-07 DIAGNOSIS — K4090 Unilateral inguinal hernia, without obstruction or gangrene, not specified as recurrent: Secondary | ICD-10-CM | POA: Diagnosis not present

## 2022-01-07 DIAGNOSIS — F17220 Nicotine dependence, chewing tobacco, uncomplicated: Secondary | ICD-10-CM | POA: Diagnosis not present

## 2022-01-09 DIAGNOSIS — R3915 Urgency of urination: Secondary | ICD-10-CM | POA: Diagnosis not present

## 2022-01-09 DIAGNOSIS — F980 Enuresis not due to a substance or known physiological condition: Secondary | ICD-10-CM | POA: Diagnosis not present

## 2022-01-09 DIAGNOSIS — N400 Enlarged prostate without lower urinary tract symptoms: Secondary | ICD-10-CM | POA: Diagnosis not present

## 2022-01-09 DIAGNOSIS — R32 Unspecified urinary incontinence: Secondary | ICD-10-CM | POA: Diagnosis not present

## 2022-01-09 DIAGNOSIS — F5232 Male orgasmic disorder: Secondary | ICD-10-CM | POA: Diagnosis not present

## 2022-01-16 DIAGNOSIS — K4090 Unilateral inguinal hernia, without obstruction or gangrene, not specified as recurrent: Secondary | ICD-10-CM | POA: Insufficient documentation

## 2022-01-18 DIAGNOSIS — D509 Iron deficiency anemia, unspecified: Secondary | ICD-10-CM | POA: Diagnosis not present

## 2022-01-23 DIAGNOSIS — R32 Unspecified urinary incontinence: Secondary | ICD-10-CM | POA: Diagnosis not present

## 2022-01-25 DIAGNOSIS — Z79899 Other long term (current) drug therapy: Secondary | ICD-10-CM | POA: Diagnosis not present

## 2022-01-25 DIAGNOSIS — I10 Essential (primary) hypertension: Secondary | ICD-10-CM | POA: Diagnosis not present

## 2022-01-25 DIAGNOSIS — Z7902 Long term (current) use of antithrombotics/antiplatelets: Secondary | ICD-10-CM | POA: Diagnosis not present

## 2022-01-25 DIAGNOSIS — Z8673 Personal history of transient ischemic attack (TIA), and cerebral infarction without residual deficits: Secondary | ICD-10-CM | POA: Diagnosis not present

## 2022-01-25 DIAGNOSIS — R569 Unspecified convulsions: Secondary | ICD-10-CM | POA: Diagnosis not present

## 2022-01-25 DIAGNOSIS — E7800 Pure hypercholesterolemia, unspecified: Secondary | ICD-10-CM | POA: Diagnosis not present

## 2022-01-25 DIAGNOSIS — I2510 Atherosclerotic heart disease of native coronary artery without angina pectoris: Secondary | ICD-10-CM | POA: Diagnosis not present

## 2022-01-25 DIAGNOSIS — E559 Vitamin D deficiency, unspecified: Secondary | ICD-10-CM | POA: Diagnosis not present

## 2022-01-25 DIAGNOSIS — K4090 Unilateral inguinal hernia, without obstruction or gangrene, not specified as recurrent: Secondary | ICD-10-CM | POA: Diagnosis not present

## 2022-01-30 DIAGNOSIS — R32 Unspecified urinary incontinence: Secondary | ICD-10-CM | POA: Diagnosis not present

## 2022-02-08 DIAGNOSIS — R399 Unspecified symptoms and signs involving the genitourinary system: Secondary | ICD-10-CM | POA: Diagnosis not present

## 2022-02-08 DIAGNOSIS — R32 Unspecified urinary incontinence: Secondary | ICD-10-CM | POA: Diagnosis not present

## 2022-02-13 DIAGNOSIS — R32 Unspecified urinary incontinence: Secondary | ICD-10-CM | POA: Diagnosis not present

## 2022-02-20 DIAGNOSIS — R32 Unspecified urinary incontinence: Secondary | ICD-10-CM | POA: Diagnosis not present

## 2022-03-07 DIAGNOSIS — R32 Unspecified urinary incontinence: Secondary | ICD-10-CM | POA: Diagnosis not present

## 2022-03-09 DIAGNOSIS — N1832 Chronic kidney disease, stage 3b: Secondary | ICD-10-CM | POA: Diagnosis not present

## 2022-03-09 DIAGNOSIS — I5030 Unspecified diastolic (congestive) heart failure: Secondary | ICD-10-CM | POA: Diagnosis not present

## 2022-03-09 DIAGNOSIS — R519 Headache, unspecified: Secondary | ICD-10-CM | POA: Diagnosis not present

## 2022-03-09 DIAGNOSIS — I2510 Atherosclerotic heart disease of native coronary artery without angina pectoris: Secondary | ICD-10-CM | POA: Diagnosis not present

## 2022-03-09 DIAGNOSIS — E039 Hypothyroidism, unspecified: Secondary | ICD-10-CM | POA: Diagnosis not present

## 2022-03-09 DIAGNOSIS — Z79899 Other long term (current) drug therapy: Secondary | ICD-10-CM | POA: Diagnosis not present

## 2022-03-09 DIAGNOSIS — I10 Essential (primary) hypertension: Secondary | ICD-10-CM | POA: Diagnosis not present

## 2022-03-09 DIAGNOSIS — Z6820 Body mass index (BMI) 20.0-20.9, adult: Secondary | ICD-10-CM | POA: Diagnosis not present

## 2022-03-09 DIAGNOSIS — R32 Unspecified urinary incontinence: Secondary | ICD-10-CM | POA: Diagnosis not present

## 2022-03-09 DIAGNOSIS — D509 Iron deficiency anemia, unspecified: Secondary | ICD-10-CM | POA: Diagnosis not present

## 2022-03-09 DIAGNOSIS — E785 Hyperlipidemia, unspecified: Secondary | ICD-10-CM | POA: Diagnosis not present

## 2022-03-13 DIAGNOSIS — R32 Unspecified urinary incontinence: Secondary | ICD-10-CM | POA: Diagnosis not present

## 2022-03-20 DIAGNOSIS — R32 Unspecified urinary incontinence: Secondary | ICD-10-CM | POA: Diagnosis not present

## 2022-03-27 DIAGNOSIS — R32 Unspecified urinary incontinence: Secondary | ICD-10-CM | POA: Diagnosis not present

## 2022-04-03 DIAGNOSIS — R339 Retention of urine, unspecified: Secondary | ICD-10-CM | POA: Diagnosis not present

## 2022-04-04 ENCOUNTER — Ambulatory Visit: Payer: Medicare Other

## 2022-04-04 ENCOUNTER — Inpatient Hospital Stay: Payer: Medicare Other | Attending: Oncology | Admitting: Oncology

## 2022-04-04 ENCOUNTER — Inpatient Hospital Stay: Payer: Medicare Other

## 2022-04-04 ENCOUNTER — Encounter: Payer: Self-pay | Admitting: Oncology

## 2022-04-04 VITALS — BP 119/72 | HR 73 | Resp 12 | Ht 66.0 in | Wt 128.4 lb

## 2022-04-04 DIAGNOSIS — I1310 Hypertensive heart and chronic kidney disease without heart failure, with stage 1 through stage 4 chronic kidney disease, or unspecified chronic kidney disease: Secondary | ICD-10-CM | POA: Diagnosis not present

## 2022-04-04 DIAGNOSIS — D684 Acquired coagulation factor deficiency: Secondary | ICD-10-CM

## 2022-04-04 DIAGNOSIS — Z79899 Other long term (current) drug therapy: Secondary | ICD-10-CM | POA: Insufficient documentation

## 2022-04-04 DIAGNOSIS — R54 Age-related physical debility: Secondary | ICD-10-CM | POA: Insufficient documentation

## 2022-04-04 DIAGNOSIS — Z8042 Family history of malignant neoplasm of prostate: Secondary | ICD-10-CM | POA: Diagnosis not present

## 2022-04-04 DIAGNOSIS — E039 Hypothyroidism, unspecified: Secondary | ICD-10-CM | POA: Diagnosis not present

## 2022-04-04 DIAGNOSIS — E559 Vitamin D deficiency, unspecified: Secondary | ICD-10-CM | POA: Diagnosis not present

## 2022-04-04 DIAGNOSIS — N400 Enlarged prostate without lower urinary tract symptoms: Secondary | ICD-10-CM | POA: Insufficient documentation

## 2022-04-04 DIAGNOSIS — E538 Deficiency of other specified B group vitamins: Secondary | ICD-10-CM | POA: Insufficient documentation

## 2022-04-04 DIAGNOSIS — D500 Iron deficiency anemia secondary to blood loss (chronic): Secondary | ICD-10-CM

## 2022-04-04 DIAGNOSIS — R350 Frequency of micturition: Secondary | ICD-10-CM

## 2022-04-04 DIAGNOSIS — K922 Gastrointestinal hemorrhage, unspecified: Secondary | ICD-10-CM | POA: Diagnosis not present

## 2022-04-04 DIAGNOSIS — T148XXA Other injury of unspecified body region, initial encounter: Secondary | ICD-10-CM | POA: Insufficient documentation

## 2022-04-04 DIAGNOSIS — N1832 Chronic kidney disease, stage 3b: Secondary | ICD-10-CM

## 2022-04-04 DIAGNOSIS — D539 Nutritional anemia, unspecified: Secondary | ICD-10-CM

## 2022-04-04 DIAGNOSIS — D631 Anemia in chronic kidney disease: Secondary | ICD-10-CM | POA: Insufficient documentation

## 2022-04-04 DIAGNOSIS — N189 Chronic kidney disease, unspecified: Secondary | ICD-10-CM | POA: Insufficient documentation

## 2022-04-04 DIAGNOSIS — Z87891 Personal history of nicotine dependence: Secondary | ICD-10-CM | POA: Diagnosis not present

## 2022-04-04 DIAGNOSIS — Z800 Family history of malignant neoplasm of digestive organs: Secondary | ICD-10-CM | POA: Insufficient documentation

## 2022-04-04 DIAGNOSIS — I2510 Atherosclerotic heart disease of native coronary artery without angina pectoris: Secondary | ICD-10-CM | POA: Diagnosis not present

## 2022-04-04 LAB — CMP (CANCER CENTER ONLY)
ALT: 29 U/L (ref 0–44)
AST: 30 U/L (ref 15–41)
Albumin: 3.7 g/dL (ref 3.5–5.0)
Alkaline Phosphatase: 76 U/L (ref 38–126)
Anion gap: 9 (ref 5–15)
BUN: 44 mg/dL — ABNORMAL HIGH (ref 8–23)
CO2: 24 mmol/L (ref 22–32)
Calcium: 8.9 mg/dL (ref 8.9–10.3)
Chloride: 107 mmol/L (ref 98–111)
Creatinine: 1.69 mg/dL — ABNORMAL HIGH (ref 0.61–1.24)
GFR, Estimated: 40 mL/min — ABNORMAL LOW (ref >60)
Glucose, Bld: 98 mg/dL (ref 70–99)
Potassium: 4.5 mmol/L (ref 3.5–5.1)
Sodium: 140 mmol/L (ref 135–145)
Total Bilirubin: 0.4 mg/dL (ref 0.3–1.2)
Total Protein: 7.5 g/dL (ref 6.5–8.1)

## 2022-04-04 LAB — CBC WITH DIFFERENTIAL/PLATELET
Abs Immature Granulocytes: 0.01 10*3/uL (ref 0.00–0.07)
Basophils Absolute: 0 10*3/uL (ref 0.0–0.1)
Basophils Relative: 0 %
Eosinophils Absolute: 0.1 10*3/uL (ref 0.0–0.5)
Eosinophils Relative: 1 %
HCT: 29.8 % — ABNORMAL LOW (ref 39.0–52.0)
Hemoglobin: 9.6 g/dL — ABNORMAL LOW (ref 13.0–17.0)
Immature Granulocytes: 0 %
Lymphocytes Relative: 23 %
Lymphs Abs: 1.7 10*3/uL (ref 0.7–4.0)
MCH: 31.3 pg (ref 26.0–34.0)
MCHC: 32.2 g/dL (ref 30.0–36.0)
MCV: 97.1 fL (ref 80.0–100.0)
Monocytes Absolute: 0.7 10*3/uL (ref 0.1–1.0)
Monocytes Relative: 9 %
Neutro Abs: 4.9 10*3/uL (ref 1.7–7.7)
Neutrophils Relative %: 67 %
Platelets: 143 10*3/uL — ABNORMAL LOW (ref 150–400)
RBC: 3.07 MIL/uL — ABNORMAL LOW (ref 4.22–5.81)
RDW: 12.1 % (ref 11.5–15.5)
WBC: 7.3 10*3/uL (ref 4.0–10.5)
nRBC: 0 % (ref 0.0–0.2)

## 2022-04-04 LAB — RETICULOCYTES
Immature Retic Fract: 4.3 % (ref 2.3–15.9)
RBC.: 3.09 MIL/uL — ABNORMAL LOW (ref 4.22–5.81)
Retic Count, Absolute: 35.2 10*3/uL (ref 19.0–186.0)
Retic Ct Pct: 1.1 % (ref 0.4–3.1)

## 2022-04-04 LAB — FOLATE: Folate: 15.6 ng/mL (ref >5.9)

## 2022-04-04 LAB — PROTIME-INR
INR: 1.1 (ref 0.8–1.2)
Prothrombin Time: 13.9 seconds (ref 11.4–15.2)

## 2022-04-04 LAB — DIRECT ANTIGLOBULIN TEST (NOT AT ARMC)
DAT, IgG: NEGATIVE
DAT, complement: NEGATIVE

## 2022-04-04 LAB — APTT: aPTT: 33 seconds (ref 24–36)

## 2022-04-04 LAB — FERRITIN: Ferritin: 182 ng/mL (ref 24–336)

## 2022-04-04 LAB — PSA: Prostatic Specific Antigen: 2.26 ng/mL (ref 0.00–4.00)

## 2022-04-04 LAB — VITAMIN B12: Vitamin B-12: 487 pg/mL (ref 180–914)

## 2022-04-04 NOTE — Progress Notes (Signed)
[facility] Initial Visit:  Patient Care Team: Ernestene Kiel, MD as PCP - General (Internal Medicine)  CHIEF COMPLAINTS/PURPOSE OF CONSULTATION:  HISTORY OF PRESENTING ILLNESS:  Joshua Parker 82 y.o. male is here because of anemia Medical history notable for BPH, coronary artery disease, cervical disc disease, CKD, gallstones, hypertension, hypothyroidism, TIA, vitamin D deficiency, right inguinal hernia, vitamin B12 deficiency  March 09, 2022 WBC 7.5 hemoglobin 9.3 MCV 94 platelet count 227 Ferritin 421 creatinine 1.58 (Cr calculated 30)  April 04, 2022: [facility]  Can not tolerate oral iron therefore received IV iron about 3 months ago.  Has not required PRBC's in the past.  Developed severe constipation from oral iron.      No reaction to IV iron.  Has a regular diet.  No history of hemorrhage postoperatively requiring transfusion.  No hematochezia, melena, hemoptysis, hematuria.   No history of intra-articular or soft tissue bleeding.  Bruises easily.  Uses Tylenol for pain.  Does not use NSAIDS for painI.  Is not taking oral anticoagulants or antiplatelet drugs.  No history of abnormal bleeding in family members Patient has symptoms of fatigue, pallor,  DOE, decreased performance status.  No history of colonoscopy, or egd and wishes to avoid instrumentation  Social:  Did furniture work and Architect.  Tobacco smoked 40 yrs; quit 20 yrs ago.  EtOH none  Review of Systems - Oncology  MEDICAL HISTORY: Past Medical History:  Diagnosis Date   Anemia    Anxiety    BPH without obstruction/lower urinary tract symptoms    CAD (coronary artery disease)    Cervical disc disease    CKD (chronic kidney disease), stage III (HCC)    Gallstones    Hypertension    Hypothyroidism    Seizure disorder (HCC)    TIA (transient ischemic attack)    Vitamin B12 deficiency    Vitamin D deficiency     SURGICAL HISTORY: Past Surgical  History:  Procedure Laterality Date   BLADDER SURGERY     CATARACT EXTRACTION     CHOLECYSTECTOMY     CYSTOSCOPY WITH INSERTION OF UROLIFT     HERNIA REPAIR     NECK SURGERY     ORIF of Left Arm     Skull Fracture needing surgery     TRANSURETHRAL RESECTION OF PROSTATE      SOCIAL HISTORY: Social History   Socioeconomic History   Marital status: Widowed    Spouse name: Not on file   Number of children: Not on file   Years of education: Not on file   Highest education level: Not on file  Occupational History   Not on file  Tobacco Use   Smoking status: Former   Smokeless tobacco: Former    Types: Nurse, children's Use: Never used  Substance and Sexual Activity   Alcohol use: Never   Drug use: Never   Sexual activity: Not on file  Other Topics Concern   Not on file  Social History Narrative   Not on file   Social Determinants of Health   Financial Resource Strain: Not on file  Food Insecurity: No Food Insecurity (04/04/2022)   Hunger Vital Sign    Worried About Running Out of Food in the Last Year: Never true    Ran Out of Food in the Last Year: Never true  Transportation Needs: No Transportation Needs (04/04/2022)   PRAPARE - Transportation    Lack of  Transportation (Medical): No    Lack of Transportation (Non-Medical): No  Physical Activity: Not on file  Stress: Not on file  Social Connections: Not on file  Intimate Partner Violence: Not At Risk (04/04/2022)   Humiliation, Afraid, Rape, and Kick questionnaire    Fear of Current or Ex-Partner: No    Emotionally Abused: No    Physically Abused: No    Sexually Abused: No    FAMILY HISTORY Family History  Problem Relation Age of Onset   Diabetes Mother    Prostate cancer Father    Colon cancer Sister    Hypertension Son    Hypothyroidism Son     ALLERGIES:  is allergic to gabapentin, codeine, iron, topiramate, and valproic acid.  MEDICATIONS:  Current Outpatient Medications  Medication Sig  Dispense Refill   acetaminophen (TYLENOL) 500 MG tablet Take 500 mg by mouth every 6 (six) hours as needed.     atorvastatin (LIPITOR) 40 MG tablet Take 40 mg by mouth every other day.     Cholecalciferol (VITAMIN D-3) 125 MCG (5000 UT) TABS Take 1 tablet by mouth daily.     cyanocobalamin (,VITAMIN B-12,) 1000 MCG/ML injection Inject 1,000 mcg into the skin every 30 (thirty) days.     furosemide (LASIX) 40 MG tablet Take 20 mg by mouth daily as needed.     levETIRAcetam (KEPPRA) 500 MG tablet Take 500 mg by mouth 2 (two) times daily.     levothyroxine (SYNTHROID) 88 MCG tablet Take 88 mcg by mouth every morning.     meclizine (ANTIVERT) 12.5 MG tablet Take 12.5 mg by mouth 3 (three) times daily as needed.     methocarbamol (ROBAXIN) 500 MG tablet Take 500 mg by mouth.     Multiple Vitamins-Minerals (PRESERVISION AREDS 2 PO) Take by mouth.     olmesartan (BENICAR) 40 MG tablet Take 20 mg by mouth daily.     ondansetron (ZOFRAN) 4 MG tablet Take 4 mg by mouth.     Rimegepant Sulfate (NURTEC) 75 MG TBDP Take 1 tablet by mouth daily at 12 noon.     silodosin (RAPAFLO) 8 MG CAPS capsule Take by mouth daily with breakfast.     tamsulosin (FLOMAX) 0.4 MG CAPS capsule Take 0.4 mg by mouth 2 (two) times daily.     No current facility-administered medications for this visit.    PHYSICAL EXAMINATION:  ECOG PERFORMANCE STATUS: 2 - Symptomatic, <50% confined to bed   Vitals:   04/04/22 1432  BP: 119/72  Pulse: 73  Resp: 12  SpO2: 100%    Filed Weights   04/04/22 1432  Weight: 128 lb 6.4 oz (58.2 kg)     Physical Exam Vitals and nursing note reviewed.  Constitutional:      Appearance: Normal appearance. He is not toxic-appearing or diaphoretic.     Comments: Thin.  Chronically ill appearing.  Temporal wasting  HENT:     Head: Normocephalic and atraumatic.     Right Ear: External ear normal.     Left Ear: External ear normal.     Nose: Nose normal.  Eyes:     General: No scleral  icterus.    Conjunctiva/sclera: Conjunctivae normal.     Pupils: Pupils are equal, round, and reactive to light.  Cardiovascular:     Rate and Rhythm: Regular rhythm. Tachycardia present.     Pulses: Normal pulses.     Heart sounds: Normal heart sounds.     No friction rub. No gallop.  Pulmonary:  Effort: Pulmonary effort is normal. No respiratory distress.     Breath sounds: Normal breath sounds. No stridor. No wheezing or rales.  Abdominal:     General: Abdomen is flat.     Tenderness: There is no abdominal tenderness. There is no guarding or rebound.  Musculoskeletal:        General: No swelling.     Cervical back: Normal range of motion and neck supple. No rigidity or tenderness.     Right lower leg: No edema.     Left lower leg: No edema.     Comments: Decreased muscle mass.  Kyphotic  Lymphadenopathy:     Head:     Right side of head: No submental, submandibular, tonsillar, preauricular, posterior auricular or occipital adenopathy.     Left side of head: No submental, submandibular, tonsillar, preauricular, posterior auricular or occipital adenopathy.     Cervical: No cervical adenopathy.     Right cervical: No superficial, deep or posterior cervical adenopathy.    Left cervical: No superficial, deep or posterior cervical adenopathy.     Upper Body:     Right upper body: No supraclavicular or axillary adenopathy.     Left upper body: No supraclavicular or axillary adenopathy.     Lower Body: No right inguinal adenopathy. No left inguinal adenopathy.  Skin:    Coloration: Skin is not jaundiced or pale.     Findings: Bruising present.  Neurological:     General: No focal deficit present.     Mental Status: He is alert and oriented to person, place, and time.     Comments: Decreased hearing  Psychiatric:        Mood and Affect: Mood normal.        Behavior: Behavior normal.        Thought Content: Thought content normal.        Judgment: Judgment normal.       LABORATORY DATA: I have personally reviewed the data as listed:  No visits with results within 1 Month(s) from this visit.  Latest known visit with results is:  Admission on 08/23/2007, Discharged on 08/26/2007  Component Date Value Ref Range Status   Sodium 08/21/2007 139   Final   Potassium 08/21/2007 3.9   Final   Chloride 08/21/2007 106   Final   CO2 08/21/2007 27   Final   Glucose, Bld 08/21/2007 108 (H)   Final   BUN 08/21/2007 13   Final   Creatinine, Ser 08/21/2007 0.99   Final   Calcium 08/21/2007 8.8   Final   GFR calc non Af Amer 08/21/2007 >60   Final   GFR calc Af Amer 08/21/2007    Final                   Value:>60                                The eGFR has been calculated                         using the MDRD equation.                         This calculation has not been                         validated  in all clinical   WBC 08/21/2007 8.6   Final   RBC 08/21/2007 4.12 (L)   Final   Hemoglobin 08/21/2007 12.7 (L)   Final   HCT 08/21/2007 36.6 (L)   Final   MCV 08/21/2007 88.9   Final   MCHC 08/21/2007 34.6   Final   RDW 08/21/2007 13.1   Final   Platelets 08/21/2007 220   Final    RADIOGRAPHIC STUDIES: I have personally reviewed the radiological images as listed and agree with the findings in the report  No results found.  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   Will obtain CBC with diff, CMP, Ferritin, B12, folate, retic count,  DAT, Haptoglobin, SPEP with IEP, Copper and Zinc levels, testosterone, PSA, PT/PTT, H pylori stool antigen.      Anemia of chronic kidney disease:  Iron is required for an adequate erythropoietic response to EPO.  Correction of iron deficiency allows lower usage of exogenous EPO.  Iron deficiency is also associated with impaired exercise performance, cognitive impairment or decreased quality of life, increased  risk in hospitalization or death in patients with CHF.  IV iron is beneficial in dialysis dependent CKD and nondialysis dependent CKD, as it is more efficacious in rising ferritin and Hb levels, while reducing ESA and transfusion requirements. In CKD patients, EPO levels are inadequately low with respect to the degree of anemia. EPO deficiency starts early in the course of CKD.  When eGFR falls below 30 ml/min per 1.73 m2 this deficiency becomes more severe.   Some CKD patients have EPO resistance, where normal range EPO levels coexist with low hemoglobin (Hb) levels, indicating that the bone marrow response to endogenous and exogenous EPO is blunted.  Therapy:  Discussed the benefit and risks (stroke, heart attack, heart failure, blood clots, and death)  of ESA's with patient.  Will initiate Epo dosing fo CKD patients not on dialysis per package insert.  Will supplement with IV iron as needed  Hypothyroidism:  On replacement.  Can contribute to anemia  Bruising:  Can be due to skin fragility.  Will check CBC, PT/PTT  Frail elderly:  Limits tolerance to instrumentation   Cancer Staging  No matching staging information was found for the patient.   No problem-specific Assessment & Plan notes found for this encounter.   Orders Placed This Encounter  Procedures   Copper, serum    Standing Status:   Future    Number of Occurrences:   1    Standing Expiration Date:   04/05/2023   Ferritin    Standing Status:   Future    Number of Occurrences:   1    Standing Expiration Date:   04/05/2023   Folate    Standing Status:   Future    Number of Occurrences:   1    Standing Expiration Date:   04/05/2023   Haptoglobin    Standing Status:   Future    Number of Occurrences:   1    Standing Expiration Date:   04/05/2023   Vitamin B12    Standing Status:   Future    Number of Occurrences:   1    Standing Expiration Date:   04/05/2023   Reticulocytes    Standing Status:   Future    Number of  Occurrences:   1    Standing Expiration Date:   04/05/2023  Multiple Myeloma Panel (SPEP&IFE w/QIG)    Standing Status:   Future    Number of Occurrences:   1    Standing Expiration Date:   04/05/2023   Kappa/lambda light chains    Standing Status:   Future    Number of Occurrences:   1    Standing Expiration Date:   04/05/2023   Zinc    Standing Status:   Future    Number of Occurrences:   1    Standing Expiration Date:   04/05/2023   CBC with Differential/Platelet   CMP (Cancer Center only)    Standing Status:   Future    Number of Occurrences:   1    Standing Expiration Date:   04/05/2023   PSA    Standing Status:   Future    Number of Occurrences:   1    Standing Expiration Date:   04/05/2023   Testosterone   H. pylori antigen, stool    Standing Status:   Future    Standing Expiration Date:   04/05/2023   Protime-INR    Standing Status:   Future    Number of Occurrences:   1    Standing Expiration Date:   04/05/2023   APTT    Standing Status:   Future    Number of Occurrences:   1    Standing Expiration Date:   04/04/2023   Direct antiglobulin test (not at [facility])    Standing Status:   Future    Number of Occurrences:   1    Standing Expiration Date:   04/05/2023   62  minutes was spent in patient care.  This included time spent preparing to see the patient (e.g., review of tests), obtaining and/or reviewing separately obtained history, counseling and educating the patient/family/caregiver, ordering medications, tests, or procedures; documenting clinical information in the electronic or other health record, independently interpreting results and communicating results to the patient/family/caregiver as well as coordination of care.       All questions were answered. The patient knows to call the clinic with any problems, questions or concerns.  This note was electronically signed.    Loni Muse, MD  04/04/2022 3:33 PM

## 2022-04-05 DIAGNOSIS — D539 Nutritional anemia, unspecified: Secondary | ICD-10-CM | POA: Diagnosis not present

## 2022-04-06 LAB — KAPPA/LAMBDA LIGHT CHAINS
Kappa free light chain: 55.4 mg/L — ABNORMAL HIGH (ref 3.3–19.4)
Kappa, lambda light chain ratio: 1.3 (ref 0.26–1.65)
Lambda free light chains: 42.6 mg/L — ABNORMAL HIGH (ref 5.7–26.3)

## 2022-04-06 LAB — HAPTOGLOBIN: Haptoglobin: 161 mg/dL (ref 38–329)

## 2022-04-06 LAB — TESTOSTERONE: Testosterone: 143 ng/dL — ABNORMAL LOW (ref 264–916)

## 2022-04-07 LAB — H. PYLORI ANTIGEN, STOOL: H pylori Ag, Stl: NEGATIVE

## 2022-04-09 LAB — COPPER, SERUM: Copper: 100 ug/dL (ref 69–132)

## 2022-04-09 LAB — ZINC: Zinc: 68 ug/dL (ref 44–115)

## 2022-04-10 DIAGNOSIS — R32 Unspecified urinary incontinence: Secondary | ICD-10-CM | POA: Diagnosis not present

## 2022-04-11 LAB — MULTIPLE MYELOMA PANEL, SERUM
Albumin SerPl Elph-Mcnc: 3.9 g/dL (ref 2.9–4.4)
Albumin/Glob SerPl: 1.4 (ref 0.7–1.7)
Alpha 1: 0.2 g/dL (ref 0.0–0.4)
Alpha2 Glob SerPl Elph-Mcnc: 0.8 g/dL (ref 0.4–1.0)
B-Globulin SerPl Elph-Mcnc: 0.9 g/dL (ref 0.7–1.3)
Gamma Glob SerPl Elph-Mcnc: 1.1 g/dL (ref 0.4–1.8)
Globulin, Total: 3 g/dL (ref 2.2–3.9)
IgA: 287 mg/dL (ref 61–437)
IgG (Immunoglobin G), Serum: 1159 mg/dL (ref 603–1613)
IgM (Immunoglobulin M), Srm: 195 mg/dL — ABNORMAL HIGH (ref 15–143)
Total Protein ELP: 6.9 g/dL (ref 6.0–8.5)

## 2022-04-12 ENCOUNTER — Inpatient Hospital Stay: Payer: Medicare Other | Attending: Oncology

## 2022-04-12 VITALS — BP 112/72 | HR 58 | Temp 97.8°F | Resp 18 | Ht 66.0 in | Wt 129.5 lb

## 2022-04-12 DIAGNOSIS — N183 Chronic kidney disease, stage 3 unspecified: Secondary | ICD-10-CM

## 2022-04-12 DIAGNOSIS — N1832 Chronic kidney disease, stage 3b: Secondary | ICD-10-CM | POA: Insufficient documentation

## 2022-04-12 DIAGNOSIS — D631 Anemia in chronic kidney disease: Secondary | ICD-10-CM | POA: Diagnosis not present

## 2022-04-12 DIAGNOSIS — D649 Anemia, unspecified: Secondary | ICD-10-CM | POA: Diagnosis not present

## 2022-04-12 MED ORDER — EPOETIN ALFA-EPBX 20000 UNIT/ML IJ SOLN
20000.0000 [IU] | Freq: Once | INTRAMUSCULAR | Status: AC
  Administered 2022-04-12: 20000 [IU] via SUBCUTANEOUS
  Filled 2022-04-12: qty 1

## 2022-04-12 NOTE — Patient Instructions (Signed)

## 2022-04-17 DIAGNOSIS — N189 Chronic kidney disease, unspecified: Secondary | ICD-10-CM | POA: Diagnosis not present

## 2022-04-17 DIAGNOSIS — N5319 Other ejaculatory dysfunction: Secondary | ICD-10-CM | POA: Diagnosis not present

## 2022-04-17 DIAGNOSIS — F5232 Male orgasmic disorder: Secondary | ICD-10-CM | POA: Diagnosis not present

## 2022-04-17 DIAGNOSIS — R32 Unspecified urinary incontinence: Secondary | ICD-10-CM | POA: Diagnosis not present

## 2022-04-17 DIAGNOSIS — R3915 Urgency of urination: Secondary | ICD-10-CM | POA: Diagnosis not present

## 2022-04-18 ENCOUNTER — Inpatient Hospital Stay (INDEPENDENT_AMBULATORY_CARE_PROVIDER_SITE_OTHER): Payer: Medicare Other | Admitting: Oncology

## 2022-04-18 ENCOUNTER — Inpatient Hospital Stay: Payer: Medicare Other

## 2022-04-18 VITALS — BP 126/70 | HR 62 | Temp 98.0°F | Resp 18 | Ht 66.0 in | Wt 127.2 lb

## 2022-04-18 DIAGNOSIS — R7989 Other specified abnormal findings of blood chemistry: Secondary | ICD-10-CM | POA: Diagnosis not present

## 2022-04-18 DIAGNOSIS — T148XXA Other injury of unspecified body region, initial encounter: Secondary | ICD-10-CM

## 2022-04-18 DIAGNOSIS — R54 Age-related physical debility: Secondary | ICD-10-CM | POA: Diagnosis not present

## 2022-04-18 DIAGNOSIS — D539 Nutritional anemia, unspecified: Secondary | ICD-10-CM

## 2022-04-18 DIAGNOSIS — N1832 Chronic kidney disease, stage 3b: Secondary | ICD-10-CM

## 2022-04-18 NOTE — Progress Notes (Signed)
[facility] Follow up Visit:  Patient Care Team: Ernestene Kiel, MD as PCP - General (Internal Medicine)  CHIEF COMPLAINTS/PURPOSE OF CONSULTATION:  HISTORY OF PRESENTING ILLNESS:  Joshua Parker 82 y.o. male is here because of anemia Medical history notable for BPH, coronary artery disease, cervical disc disease, CKD, gallstones, hypertension, hypothyroidism, TIA, vitamin D deficiency, right inguinal hernia, vitamin B12 deficiency  March 09, 2022 WBC 7.5 hemoglobin 9.3 MCV 94 platelet count 227 Ferritin 421 creatinine 1.58 (Cr calculated 30)  April 04, 2022: [facility] Hematology Consult  Can not tolerate oral iron therefore received IV iron about 3 months ago.  Has not required PRBC's in the past.  Developed severe constipation from oral iron.      No reaction to IV iron.  Has a regular diet.  No history of hemorrhage postoperatively requiring transfusion.  No hematochezia, melena, hemoptysis, hematuria.   No history of intra-articular or soft tissue bleeding.  Bruises easily.  Uses Tylenol for pain.  Does not use NSAIDS for painI.  Is not taking oral anticoagulants or antiplatelet drugs.  No history of abnormal bleeding in family members Patient has symptoms of fatigue, pallor,  DOE, decreased performance status.  No history of colonoscopy, or egd and wishes to avoid instrumentation  Social:  Did furniture work and Architect.  Tobacco smoked 40 yrs; quit 20 yrs ago.  EtOH none  WBC 7.3 hemoglobin 9.6 MCV 97 platelet count 143; 67 segs 23 lymphs 9 monos 1 EO leukocyte count 1.1% Coombs test negative haptoglobin 161 INR 1.1 PTT 33 SPEP with IEP showed no monoclonal protein.  Serum free kappa 55.4 lambda 42.6 with a kappa lambda 1.30 IgG 1159 IgA 287 IgM 195 PSA 2.26.  H. pylori stool antigen negative Ferritin 182 folate 15.6 B12 487 Copper 100 zinc 68 CMP notable for creatinine 1.69 BUN 44 Testosterone 143  April 12, 2022: Erythropoietin 20,000  units  April 18, 2022:  Scheduled follow-up for management of anemia.  Reviewed results of labs with patient and son.  Feels better since receiving Epo.  Discussed how Epo works to help with anemia  Review of Systems - Oncology  MEDICAL HISTORY: Past Medical History:  Diagnosis Date   Anemia    Anxiety    BPH without obstruction/lower urinary tract symptoms    CAD (coronary artery disease)    Cervical disc disease    CKD (chronic kidney disease), stage III (HCC)    Gallstones    Hypertension    Hypothyroidism    Seizure disorder (HCC)    TIA (transient ischemic attack)    Vitamin B12 deficiency    Vitamin D deficiency     SURGICAL HISTORY: Past Surgical History:  Procedure Laterality Date   BLADDER SURGERY     CATARACT EXTRACTION     CHOLECYSTECTOMY     CYSTOSCOPY WITH INSERTION OF UROLIFT     HERNIA REPAIR     NECK SURGERY     ORIF of Left Arm     Skull Fracture needing surgery     TRANSURETHRAL RESECTION OF PROSTATE      SOCIAL HISTORY: Social History   Socioeconomic History   Marital status: Widowed    Spouse name: Not on file   Number of children: Not on file   Years of education: Not on file   Highest education level: Not on file  Occupational History   Not on file  Tobacco Use   Smoking status: Former   Smokeless tobacco: Former  Types: Chew  Vaping Use   Vaping Use: Never used  Substance and Sexual Activity   Alcohol use: Never   Drug use: Never   Sexual activity: Not on file  Other Topics Concern   Not on file  Social History Narrative   Not on file   Social Determinants of Health   Financial Resource Strain: Not on file  Food Insecurity: No Food Insecurity (04/04/2022)   Hunger Vital Sign    Worried About Running Out of Food in the Last Year: Never true    Ran Out of Food in the Last Year: Never true  Transportation Needs: No Transportation Needs (04/04/2022)   PRAPARE - Hydrologist (Medical): No     Lack of Transportation (Non-Medical): No  Physical Activity: Not on file  Stress: Not on file  Social Connections: Not on file  Intimate Partner Violence: Not At Risk (04/04/2022)   Humiliation, Afraid, Rape, and Kick questionnaire    Fear of Current or Ex-Partner: No    Emotionally Abused: No    Physically Abused: No    Sexually Abused: No    FAMILY HISTORY Family History  Problem Relation Age of Onset   Diabetes Mother    Prostate cancer Father    Colon cancer Sister    Hypertension Son    Hypothyroidism Son     ALLERGIES:  is allergic to gabapentin, codeine, iron, topiramate, and valproic acid.  MEDICATIONS:  Current Outpatient Medications  Medication Sig Dispense Refill   acetaminophen (TYLENOL) 500 MG tablet Take 500 mg by mouth every 6 (six) hours as needed.     atorvastatin (LIPITOR) 40 MG tablet Take 40 mg by mouth every other day.     Cholecalciferol (VITAMIN D-3) 125 MCG (5000 UT) TABS Take 1 tablet by mouth daily.     cyanocobalamin (,VITAMIN B-12,) 1000 MCG/ML injection Inject 1,000 mcg into the skin every 30 (thirty) days.     furosemide (LASIX) 40 MG tablet Take 20 mg by mouth daily as needed.     levETIRAcetam (KEPPRA) 500 MG tablet Take 500 mg by mouth 2 (two) times daily.     levothyroxine (SYNTHROID) 88 MCG tablet Take 88 mcg by mouth every morning.     meclizine (ANTIVERT) 12.5 MG tablet Take 12.5 mg by mouth 3 (three) times daily as needed.     methocarbamol (ROBAXIN) 500 MG tablet Take 500 mg by mouth.     Multiple Vitamins-Minerals (PRESERVISION AREDS 2 PO) Take by mouth.     olmesartan (BENICAR) 40 MG tablet Take 20 mg by mouth daily.     ondansetron (ZOFRAN) 4 MG tablet Take 4 mg by mouth.     Rimegepant Sulfate (NURTEC) 75 MG TBDP Take 1 tablet by mouth daily at 12 noon.     silodosin (RAPAFLO) 8 MG CAPS capsule Take by mouth daily with breakfast.     tamsulosin (FLOMAX) 0.4 MG CAPS capsule Take 0.4 mg by mouth 2 (two) times daily.     No current  facility-administered medications for this visit.    PHYSICAL EXAMINATION:  ECOG PERFORMANCE STATUS: 2 - Symptomatic, <50% confined to bed   There were no vitals filed for this visit.   There were no vitals filed for this visit.    Physical Exam Vitals and nursing note reviewed.  Constitutional:      Appearance: Normal appearance. He is not toxic-appearing or diaphoretic.     Comments: Thin.  Chronically ill appearing.  Temporal  wasting  HENT:     Head: Normocephalic and atraumatic.     Right Ear: External ear normal.     Left Ear: External ear normal.     Nose: Nose normal.  Eyes:     General: No scleral icterus.    Conjunctiva/sclera: Conjunctivae normal.     Pupils: Pupils are equal, round, and reactive to light.  Cardiovascular:     Rate and Rhythm: Regular rhythm. Tachycardia present.     Pulses: Normal pulses.     Heart sounds: Normal heart sounds.     No friction rub. No gallop.  Pulmonary:     Effort: Pulmonary effort is normal. No respiratory distress.     Breath sounds: Normal breath sounds. No stridor. No wheezing or rales.  Abdominal:     General: Abdomen is flat.     Tenderness: There is no abdominal tenderness. There is no guarding or rebound.  Musculoskeletal:        General: No swelling.     Cervical back: Normal range of motion and neck supple. No rigidity or tenderness.     Right lower leg: No edema.     Left lower leg: No edema.     Comments: Decreased muscle mass.  Kyphotic  Lymphadenopathy:     Head:     Right side of head: No submental, submandibular, tonsillar, preauricular, posterior auricular or occipital adenopathy.     Left side of head: No submental, submandibular, tonsillar, preauricular, posterior auricular or occipital adenopathy.     Cervical: No cervical adenopathy.     Right cervical: No superficial, deep or posterior cervical adenopathy.    Left cervical: No superficial, deep or posterior cervical adenopathy.     Upper Body:      Right upper body: No supraclavicular or axillary adenopathy.     Left upper body: No supraclavicular or axillary adenopathy.     Lower Body: No right inguinal adenopathy. No left inguinal adenopathy.  Skin:    Coloration: Skin is not jaundiced or pale.     Findings: Bruising present.  Neurological:     General: No focal deficit present.     Mental Status: He is alert and oriented to person, place, and time.     Comments: Decreased hearing  Psychiatric:        Mood and Affect: Mood normal.        Behavior: Behavior normal.        Thought Content: Thought content normal.        Judgment: Judgment normal.      LABORATORY DATA: I have personally reviewed the data as listed:  Abstract on 04/07/2022  Component Date Value Ref Range Status   H pylori Ag, Stl 04/05/2022 NEGATIVE   Final  Appointment on 04/04/2022  Component Date Value Ref Range Status   Prostatic Specific Antigen 04/04/2022 2.26  0.00 - 4.00 ng/mL Final   Comment: (NOTE) While PSA levels of <=4.00 ng/ml are reported as reference range, some men with levels below 4.00 ng/ml can have prostate cancer and many men with PSA above 4.00 ng/ml do not have prostate cancer.  Other tests such as free PSA, age specific reference ranges, PSA velocity and PSA doubling time may be helpful especially in men less than 45 years old. Performed at [facility], [place] [address] [place], [place] [address]    Sodium 04/04/2022 140  135 - 145 mmol/L Final   Potassium 04/04/2022 4.5  3.5 - 5.1 mmol/L Final  Chloride 04/04/2022 107  98 - 111 mmol/L Final   CO2 04/04/2022 24  22 - 32 mmol/L Final   Glucose, Bld 04/04/2022 98  70 - 99 mg/dL Final   Glucose reference range applies only to samples taken after fasting for at least 8 hours.   BUN 04/04/2022 44 (H)  8 - 23 mg/dL Final   Creatinine 04/04/2022 1.69 (H)  0.61 - 1.24 mg/dL Final   Calcium 04/04/2022 8.9  8.9 - 10.3 mg/dL Final   Total Protein 04/04/2022 7.5  6.5  - 8.1 g/dL Final   Albumin 04/04/2022 3.7  3.5 - 5.0 g/dL Final   AST 04/04/2022 30  15 - 41 U/L Final   ALT 04/04/2022 29  0 - 44 U/L Final   Alkaline Phosphatase 04/04/2022 76  38 - 126 U/L Final   Total Bilirubin 04/04/2022 0.4  0.3 - 1.2 mg/dL Final   GFR, Estimated 04/04/2022 40 (L)  >60 mL/min Final   Comment: (NOTE) Calculated using the CKD-EPI Creatinine Equation (2021)    Anion gap 04/04/2022 9  5 - 15 Final   Performed at [facility], [place] [address] [place], [place] [address]   Zinc 04/04/2022 68  44 - 115 ug/dL Final   Comment: (NOTE) This test was developed and its performance characteristics determined by [facility]. It has not been cleared or approved by the Food and Drug Administration.                                Detection Limit = 5 Performed At: [facility] [place], [place] [id number] Rush Farmer MD [id number]    Kappa free light chain 04/04/2022 55.4 (H)  3.3 - 19.4 mg/L Final   Lambda free light chains 04/04/2022 42.6 (H)  5.7 - 26.3 mg/L Final   Kappa, lambda light chain ratio 04/04/2022 1.30  0.26 - 1.65 Final   Comment: (NOTE) Performed At: [facility] [place], [place] [id number] Rush Farmer MD [id number]    IgG (Immunoglobin G), Serum 04/04/2022 1,159  603 - 1,613 mg/dL Final   IgA 04/04/2022 287  61 - 437 mg/dL Final   IgM (Immunoglobulin M), Srm 04/04/2022 195 (H)  15 - 143 mg/dL Final   Total Protein ELP 04/04/2022 6.9  6.0 - 8.5 g/dL Corrected   Albumin SerPl Elph-Mcnc 04/04/2022 3.9  2.9 - 4.4 g/dL Corrected   Alpha 1 04/04/2022 0.2  0.0 - 0.4 g/dL Corrected   Alpha2 Glob SerPl Elph-Mcnc 04/04/2022 0.8  0.4 - 1.0 g/dL Corrected   B-Globulin SerPl Elph-Mcnc 04/04/2022 0.9  0.7 - 1.3 g/dL Corrected   Gamma Glob SerPl Elph-Mcnc 04/04/2022 1.1  0.4 - 1.8 g/dL Corrected   M Protein SerPl Elph-Mcnc 04/04/2022 Not Observed  Not Observed g/dL Corrected   Globulin, Total  04/04/2022 3.0  2.2 - 3.9 g/dL Corrected   Albumin/Glob SerPl 04/04/2022 1.4  0.7 - 1.7 Corrected   IFE 1 04/04/2022 Comment   Corrected   Comment: (NOTE) The immunofixation pattern appears unremarkable. Evidence of monoclonal protein is not apparent.    Please Note 04/04/2022 Comment   Corrected   Comment: (NOTE) Protein electrophoresis scan will follow via computer, mail, or courier delivery. Performed At: [facility] [place], [place] [id number] Rush Farmer MD [id number]    Retic Ct Pct 04/04/2022 1.1  0.4 - 3.1 % Final   RBC. 04/04/2022 3.09 (L)  4.22 - 5.81 MIL/uL Final   Retic Count, Absolute 04/04/2022 35.2  19.0 - 186.0 K/uL Final   Immature Retic Fract 04/04/2022 4.3  2.3 - 15.9 % Final   Performed at [facility], [place] [address] [place], [place] [address]   Vitamin B-12 04/04/2022 487  180 - 914 pg/mL Final   Comment: (NOTE) This assay is not validated for testing neonatal or myeloproliferative syndrome specimens for Vitamin B12 levels. Performed at [facility], [place] [address] [place], [place] [address]    Haptoglobin 04/04/2022 161  38 - 329 mg/dL Final   Comment: (NOTE) Performed At: [facility] [place], [place] [id number] Rush Farmer MD [id number]    Folate 04/04/2022 15.6  >5.9 ng/mL Final   Performed at [facility], [place] [address], [place], [place] [address]   Ferritin 04/04/2022 182  24 - 336 ng/mL Final   Performed at [facility], [place] [address], [place], [place] [address]   DAT, complement 04/04/2022 NEG   Final   DAT, IgG 04/04/2022    Final                   Value:NEG Performed at [facility], [place] [address] [place], [place] [address]    Copper 04/04/2022 100  69 - 132 ug/dL Final   Comment: (NOTE) This test was developed and its performance characteristics determined by [facility]. It has not  been cleared or approved by the Food and Drug Administration.                                Detection Limit = 5 Performed At: [facility] [place], [place] [id number] Rush Farmer MD [id number]    Prothrombin Time 04/04/2022 13.9  11.4 - 15.2 seconds Final   INR 04/04/2022 1.1  0.8 - 1.2 Final   Comment: (NOTE) INR goal varies based on device and disease states. Performed at [facility], [place] [address] [place], [place] [address]    aPTT 04/04/2022 33  24 - 36 seconds Final   Performed at [facility], [place] [address], [place], [place] [address]  Office Visit on 04/04/2022  Component Date Value Ref Range Status   WBC 04/04/2022 7.3  4.0 - 10.5 K/uL Final   RBC 04/04/2022 3.07 (L)  4.22 - 5.81 MIL/uL Final   Hemoglobin 04/04/2022 9.6 (L)  13.0 - 17.0 g/dL Final   HCT 04/04/2022 29.8 (L)  39.0 - 52.0 % Final   MCV 04/04/2022 97.1  80.0 - 100.0 fL Final   MCH 04/04/2022 31.3  26.0 - 34.0 pg Final   MCHC 04/04/2022 32.2  30.0 - 36.0 g/dL Final   RDW 04/04/2022 12.1  11.5 - 15.5 % Final   Platelets 04/04/2022 143 (L)  150 - 400 K/uL Final   nRBC 04/04/2022 0.0  0.0 - 0.2 % Final   Neutrophils Relative % 04/04/2022 67  % Final   Neutro Abs 04/04/2022 4.9  1.7 - 7.7 K/uL Final   Lymphocytes Relative 04/04/2022 23  % Final   Lymphs Abs 04/04/2022 1.7  0.7 - 4.0 K/uL Final   Monocytes Relative 04/04/2022 9  % Final   Monocytes Absolute 04/04/2022 0.7  0.1 - 1.0 K/uL Final   Eosinophils Relative 04/04/2022 1  % Final   Eosinophils Absolute 04/04/2022 0.1  0.0 - 0.5 K/uL  Final   Basophils Relative 04/04/2022 0  % Final   Basophils Absolute 04/04/2022 0.0  0.0 - 0.1 K/uL Final   Immature Granulocytes 04/04/2022 0  % Final   Abs Immature Granulocytes 04/04/2022 0.01  0.00 - 0.07 K/uL Final   Performed at [facility], [place] [address], [place], [place] [address]   Testosterone 04/04/2022 143 (L)  264 - 916  ng/dL Final   Comment: (NOTE) Adult male reference interval is based on a population of healthy nonobese males (BMI <30) between 59 and 43 years old. [place], [place] [phone]. PMID: [id number]. Performed At: [facility] [address] [place], [place] [id number] Rush Farmer MD [id number]     RADIOGRAPHIC STUDIES: I have personally reviewed the radiological images as listed and agree with the findings in the report  No results found.  ASSESSMENT/PLAN  Anemia:  Multifactorial.  Main contributions are CKD (Cr Clearance 30) and low testosterone level April 12 2021:  Epo 20 000 units.   April 18 2021:  Will plan on following Hgb and ferritin levels.  May ultimately require IV iron as stores mobilized to make Hgb.  Holding on testosterone replacement due to thrombotic risk.    Hypothyroidism:  On replacement.  Can contribute to anemia  Bruising:  Can be due to skin fragility.   April 04 2022:   PLT count, PT/PTT are normal  Frail elderly:  Limits tolerance to instrumentation  Follow up:  1 month.  Will check CBC with diff and Ferritin on return   Cancer Staging  No matching staging information was found for the patient.   No problem-specific Assessment & Plan notes found for this encounter.   No orders of the defined types were placed in this encounter.  33  minutes was spent in patient care.  This included time spent preparing to see the patient (e.g., review of tests), obtaining and/or reviewing separately obtained history, counseling and educating the patient/family/caregiver, ordering medications, tests, or procedures; documenting clinical information in the electronic or other health record, independently interpreting results and communicating results to the patient/family/caregiver as well as coordination of care.       All questions were answered. The patient knows to call the clinic with any problems, questions or concerns.  This note  was electronically signed.    Barbee Cough, MD  04/18/2022 1:02 PM

## 2022-04-24 DIAGNOSIS — H353133 Nonexudative age-related macular degeneration, bilateral, advanced atrophic without subfoveal involvement: Secondary | ICD-10-CM | POA: Diagnosis not present

## 2022-04-24 DIAGNOSIS — H02052 Trichiasis without entropian right lower eyelid: Secondary | ICD-10-CM | POA: Diagnosis not present

## 2022-04-26 ENCOUNTER — Inpatient Hospital Stay: Payer: Medicare Other

## 2022-04-26 VITALS — BP 116/66 | HR 61 | Temp 98.6°F | Resp 18 | Ht 66.0 in | Wt 126.0 lb

## 2022-04-26 DIAGNOSIS — N183 Chronic kidney disease, stage 3 unspecified: Secondary | ICD-10-CM

## 2022-04-26 DIAGNOSIS — D631 Anemia in chronic kidney disease: Secondary | ICD-10-CM | POA: Diagnosis not present

## 2022-04-26 DIAGNOSIS — N1832 Chronic kidney disease, stage 3b: Secondary | ICD-10-CM | POA: Diagnosis not present

## 2022-04-26 MED ORDER — EPOETIN ALFA-EPBX 20000 UNIT/ML IJ SOLN
20000.0000 [IU] | Freq: Once | INTRAMUSCULAR | Status: AC
  Administered 2022-04-26: 20000 [IU] via SUBCUTANEOUS
  Filled 2022-04-26: qty 1

## 2022-04-26 NOTE — Patient Instructions (Signed)

## 2022-05-08 DIAGNOSIS — R9341 Abnormal radiologic findings on diagnostic imaging of renal pelvis, ureter, or bladder: Secondary | ICD-10-CM | POA: Diagnosis not present

## 2022-05-08 DIAGNOSIS — N189 Chronic kidney disease, unspecified: Secondary | ICD-10-CM | POA: Diagnosis not present

## 2022-05-16 ENCOUNTER — Inpatient Hospital Stay: Payer: Medicare Other

## 2022-05-16 ENCOUNTER — Inpatient Hospital Stay: Payer: Medicare Other | Attending: Oncology | Admitting: Oncology

## 2022-05-16 VITALS — BP 131/67 | HR 77 | Temp 98.0°F | Resp 16 | Ht 66.0 in | Wt 127.1 lb

## 2022-05-16 DIAGNOSIS — N1832 Chronic kidney disease, stage 3b: Secondary | ICD-10-CM | POA: Diagnosis not present

## 2022-05-16 DIAGNOSIS — D631 Anemia in chronic kidney disease: Secondary | ICD-10-CM | POA: Diagnosis not present

## 2022-05-16 DIAGNOSIS — Z79899 Other long term (current) drug therapy: Secondary | ICD-10-CM | POA: Diagnosis not present

## 2022-05-16 DIAGNOSIS — R54 Age-related physical debility: Secondary | ICD-10-CM | POA: Diagnosis not present

## 2022-05-16 DIAGNOSIS — D539 Nutritional anemia, unspecified: Secondary | ICD-10-CM

## 2022-05-16 DIAGNOSIS — R7989 Other specified abnormal findings of blood chemistry: Secondary | ICD-10-CM | POA: Diagnosis not present

## 2022-05-16 LAB — CBC WITH DIFFERENTIAL/PLATELET
Abs Immature Granulocytes: 0.01 10*3/uL (ref 0.00–0.07)
Basophils Absolute: 0 10*3/uL (ref 0.0–0.1)
Basophils Relative: 0 %
Eosinophils Absolute: 0.1 10*3/uL (ref 0.0–0.5)
Eosinophils Relative: 1 %
HCT: 35.4 % — ABNORMAL LOW (ref 39.0–52.0)
Hemoglobin: 11.2 g/dL — ABNORMAL LOW (ref 13.0–17.0)
Immature Granulocytes: 0 %
Lymphocytes Relative: 17 %
Lymphs Abs: 1 10*3/uL (ref 0.7–4.0)
MCH: 31 pg (ref 26.0–34.0)
MCHC: 31.6 g/dL (ref 30.0–36.0)
MCV: 98.1 fL (ref 80.0–100.0)
Monocytes Absolute: 0.8 10*3/uL (ref 0.1–1.0)
Monocytes Relative: 13 %
Neutro Abs: 4.2 10*3/uL (ref 1.7–7.7)
Neutrophils Relative %: 69 %
Platelets: 99 10*3/uL — ABNORMAL LOW (ref 150–400)
RBC: 3.61 MIL/uL — ABNORMAL LOW (ref 4.22–5.81)
RDW: 12.7 % (ref 11.5–15.5)
WBC: 6.1 10*3/uL (ref 4.0–10.5)
nRBC: 0 % (ref 0.0–0.2)

## 2022-05-16 LAB — BASIC METABOLIC PANEL
Anion gap: 5 (ref 5–15)
BUN: 29 mg/dL — ABNORMAL HIGH (ref 8–23)
CO2: 27 mmol/L (ref 22–32)
Calcium: 9 mg/dL (ref 8.9–10.3)
Chloride: 112 mmol/L — ABNORMAL HIGH (ref 98–111)
Creatinine, Ser: 1.66 mg/dL — ABNORMAL HIGH (ref 0.61–1.24)
GFR, Estimated: 41 mL/min — ABNORMAL LOW (ref >60)
Glucose, Bld: 89 mg/dL (ref 70–99)
Potassium: 3.5 mmol/L (ref 3.5–5.1)
Sodium: 144 mmol/L (ref 135–145)

## 2022-05-16 LAB — FERRITIN: Ferritin: 194 ng/mL (ref 24–336)

## 2022-05-16 NOTE — Progress Notes (Unsigned)
[facility] Follow up Visit:  Patient Care Team: Ernestene Kiel, MD as PCP - General (Internal Medicine)  CHIEF COMPLAINTS/PURPOSE OF CONSULTATION:  HISTORY OF PRESENTING ILLNESS:  Joshua Parker 82 y.o. male is here because of anemia Medical history notable for BPH, coronary artery disease, cervical disc disease, CKD, gallstones, hypertension, hypothyroidism, TIA, vitamin D deficiency, right inguinal hernia, vitamin B12 deficiency  March 09, 2022 WBC 7.5 hemoglobin 9.3 MCV 94 platelet count 227 Ferritin 421 creatinine 1.58 (Cr calculated 30)  April 04, 2022: [facility] Consult  Can not tolerate oral iron therefore received IV iron about 3 months ago.  Has not required PRBC's in the past.  Developed severe constipation from oral iron.      No reaction to IV iron.  Has a regular diet.  No history of hemorrhage postoperatively requiring transfusion.  No hematochezia, melena, hemoptysis, hematuria.   No history of intra-articular or soft tissue bleeding.  Bruises easily.  Uses Tylenol for pain.  Does not use NSAIDS for painI.  Is not taking oral anticoagulants or antiplatelet drugs.  No history of abnormal bleeding in family members Patient has symptoms of fatigue, pallor,  DOE, decreased performance status.  No history of colonoscopy, or egd and wishes to avoid instrumentation  Social:  Did furniture work and Architect.  Tobacco smoked 40 yrs; quit 20 yrs ago.  EtOH none  WBC 7.3 hemoglobin 9.6 MCV 97 platelet count 143; 67 segs 23 lymphs 9 monos 1 EO leukocyte count 1.1% Coombs test negative haptoglobin 161 INR 1.1 PTT 33 SPEP with IEP showed no monoclonal protein.  Serum free kappa 55.4 lambda 42.6 with a kappa lambda 1.30 IgG 1159 IgA 287 IgM 195 PSA 2.26.  H. pylori stool antigen negative Ferritin 182 folate 15.6 B12 487 Copper 100 zinc 68 CMP notable for creatinine 1.69 BUN 44 Testosterone 143  April 12, 2022: Erythropoietin 20,000  units  April 18, 2022:   Reviewed results of labs with patient and son.  Feels better since receiving Epo.  Discussed how Epo works to help with anemia April 26, 2022: EPO 20,000 units  May 16 2022:  Scheduled follow-up for management of anemia.  Feel well.  Appetite good.  Weight stable.   Energy good.  Son notices that patient is more active  Review of Systems - Oncology  MEDICAL HISTORY: Past Medical History:  Diagnosis Date   Anemia    Anxiety    BPH without obstruction/lower urinary tract symptoms    CAD (coronary artery disease)    Cervical disc disease    CKD (chronic kidney disease), stage III (HCC)    Gallstones    Hypertension    Hypothyroidism    Seizure disorder (HCC)    TIA (transient ischemic attack)    Vitamin B12 deficiency    Vitamin D deficiency     SURGICAL HISTORY: Past Surgical History:  Procedure Laterality Date   BLADDER SURGERY     CATARACT EXTRACTION     CHOLECYSTECTOMY     CYSTOSCOPY WITH INSERTION OF UROLIFT     HERNIA REPAIR     NECK SURGERY     ORIF of Left Arm     Skull Fracture needing surgery     TRANSURETHRAL RESECTION OF PROSTATE      SOCIAL HISTORY: Social History   Socioeconomic History   Marital status: Widowed    Spouse name: Not on file   Number of children: Not on file   Years of education:  Not on file   Highest education level: Not on file  Occupational History   Not on file  Tobacco Use   Smoking status: Former   Smokeless tobacco: Former    Types: Nurse, children's Use: Never used  Substance and Sexual Activity   Alcohol use: Never   Drug use: Never   Sexual activity: Not on file  Other Topics Concern   Not on file  Social History Narrative   Not on file   Social Determinants of Health   Financial Resource Strain: Not on file  Food Insecurity: No Food Insecurity (04/04/2022)   Hunger Vital Sign    Worried About Running Out of Food in the Last Year: Never true    Ran Out of Food in the  Last Year: Never true  Transportation Needs: No Transportation Needs (04/04/2022)   PRAPARE - Hydrologist (Medical): No    Lack of Transportation (Non-Medical): No  Physical Activity: Not on file  Stress: Not on file  Social Connections: Not on file  Intimate Partner Violence: Not At Risk (04/04/2022)   Humiliation, Afraid, Rape, and Kick questionnaire    Fear of Current or Ex-Partner: No    Emotionally Abused: No    Physically Abused: No    Sexually Abused: No    FAMILY HISTORY Family History  Problem Relation Age of Onset   Diabetes Mother    Prostate cancer Father    Colon cancer Sister    Hypertension Son    Hypothyroidism Son     ALLERGIES:  is allergic to gabapentin, codeine, iron, topiramate, and valproic acid.  MEDICATIONS:  Current Outpatient Medications  Medication Sig Dispense Refill   acetaminophen (TYLENOL) 500 MG tablet Take 500 mg by mouth every 6 (six) hours as needed.     atorvastatin (LIPITOR) 40 MG tablet Take 40 mg by mouth every other day.     Cholecalciferol (VITAMIN D-3) 125 MCG (5000 UT) TABS Take 1 tablet by mouth daily.     cyanocobalamin (,VITAMIN B-12,) 1000 MCG/ML injection Inject 1,000 mcg into the skin every 30 (thirty) days.     furosemide (LASIX) 40 MG tablet Take 20 mg by mouth daily as needed.     levETIRAcetam (KEPPRA) 500 MG tablet Take 500 mg by mouth 2 (two) times daily.     levothyroxine (SYNTHROID) 88 MCG tablet Take 88 mcg by mouth every morning.     meclizine (ANTIVERT) 12.5 MG tablet Take 12.5 mg by mouth 3 (three) times daily as needed.     methocarbamol (ROBAXIN) 500 MG tablet Take 500 mg by mouth.     Multiple Vitamins-Minerals (PRESERVISION AREDS 2 PO) Take by mouth.     olmesartan (BENICAR) 40 MG tablet Take 20 mg by mouth daily.     ondansetron (ZOFRAN) 4 MG tablet Take 4 mg by mouth.     Rimegepant Sulfate (NURTEC) 75 MG TBDP Take 1 tablet by mouth daily at 12 noon.     silodosin (RAPAFLO) 8 MG  CAPS capsule Take by mouth daily with breakfast.     tamsulosin (FLOMAX) 0.4 MG CAPS capsule Take 0.4 mg by mouth 2 (two) times daily.     No current facility-administered medications for this visit.    PHYSICAL EXAMINATION:  ECOG PERFORMANCE STATUS: 2 - Symptomatic, <50% confined to bed   Vitals:   05/16/22 1316  BP: 131/67  Pulse: 77  Resp: 16  Temp: 98 F (36.7 C)  SpO2: 100%     Filed Weights   05/16/22 1316  Weight: 127 lb 1.6 oz (57.7 kg)      Physical Exam Vitals and nursing note reviewed.  Constitutional:      Appearance: Normal appearance. He is not toxic-appearing or diaphoretic.     Comments: Thin.  Chronically ill appearing.  Temporal wasting  HENT:     Head: Normocephalic and atraumatic.     Right Ear: External ear normal.     Left Ear: External ear normal.     Nose: Nose normal.  Eyes:     General: No scleral icterus.    Conjunctiva/sclera: Conjunctivae normal.     Pupils: Pupils are equal, round, and reactive to light.  Cardiovascular:     Rate and Rhythm: Regular rhythm. Tachycardia present.     Pulses: Normal pulses.     Heart sounds: Normal heart sounds.     No friction rub. No gallop.  Pulmonary:     Effort: Pulmonary effort is normal. No respiratory distress.     Breath sounds: Normal breath sounds. No stridor. No wheezing or rales.  Abdominal:     General: Abdomen is flat.     Tenderness: There is no abdominal tenderness. There is no guarding or rebound.  Musculoskeletal:        General: No swelling.     Cervical back: Normal range of motion and neck supple. No rigidity or tenderness.     Right lower leg: No edema.     Left lower leg: No edema.     Comments: Decreased muscle mass.  Kyphotic  Lymphadenopathy:     Head:     Right side of head: No submental, submandibular, tonsillar, preauricular, posterior auricular or occipital adenopathy.     Left side of head: No submental, submandibular, tonsillar, preauricular, posterior auricular  or occipital adenopathy.     Cervical: No cervical adenopathy.     Right cervical: No superficial, deep or posterior cervical adenopathy.    Left cervical: No superficial, deep or posterior cervical adenopathy.     Upper Body:     Right upper body: No supraclavicular or axillary adenopathy.     Left upper body: No supraclavicular or axillary adenopathy.     Lower Body: No right inguinal adenopathy. No left inguinal adenopathy.  Skin:    Coloration: Skin is not jaundiced or pale.     Findings: Bruising present.  Neurological:     General: No focal deficit present.     Mental Status: He is alert and oriented to person, place, and time.     Comments: Decreased hearing  Psychiatric:        Mood and Affect: Mood normal.        Behavior: Behavior normal.        Thought Content: Thought content normal.        Judgment: Judgment normal.     LABORATORY DATA: I have personally reviewed the data as listed:  No visits with results within 1 Month(s) from this visit.  Latest known visit with results is:  Abstract on 04/07/2022  Component Date Value Ref Range Status   H pylori Ag, Stl 04/05/2022 NEGATIVE   Final    RADIOGRAPHIC STUDIES: I have personally reviewed the radiological images as listed and agree with the findings in the report  No results found.  ASSESSMENT/PLAN  Anemia:  Multifactorial.  Main contributions are CKD (Cr Clearance 30) and low testosterone level April 12 2021:  Epo 20 000  units.   April 18 2021:  Will plan on following Hgb and ferritin levels.  May ultimately require IV iron as stores mobilized to make Hgb.  Holding on testosterone replacement due to thrombotic risk.    Hypothyroidism:  On replacement.  Can contribute to anemia  Bruising:  Can be due to skin fragility.   April 04 2022:   PLT count, PT/PTT are normal  Frail elderly:  Limits tolerance to instrumentation  Follow up:  1 month.  Will check CBC with diff and Ferritin on return   Cancer  Staging  No matching staging information was found for the patient.   No problem-specific Assessment & Plan notes found for this encounter.   No orders of the defined types were placed in this encounter.  33  minutes was spent in patient care.  This included time spent preparing to see the patient (e.g., review of tests), obtaining and/or reviewing separately obtained history, counseling and educating the patient/family/caregiver, ordering medications, tests, or procedures; documenting clinical information in the electronic or other health record, independently interpreting results and communicating results to the patient/family/caregiver as well as coordination of care.       All questions were answered. The patient knows to call the clinic with any problems, questions or concerns.  This note was electronically signed.    Barbee Cough, MD  05/16/2022 1:18 PM

## 2022-05-18 ENCOUNTER — Encounter: Payer: Self-pay | Admitting: Oncology

## 2022-05-22 DIAGNOSIS — R3915 Urgency of urination: Secondary | ICD-10-CM | POA: Diagnosis not present

## 2022-05-22 DIAGNOSIS — N138 Other obstructive and reflux uropathy: Secondary | ICD-10-CM | POA: Diagnosis not present

## 2022-05-22 DIAGNOSIS — N5319 Other ejaculatory dysfunction: Secondary | ICD-10-CM | POA: Diagnosis not present

## 2022-05-22 DIAGNOSIS — R32 Unspecified urinary incontinence: Secondary | ICD-10-CM | POA: Diagnosis not present

## 2022-05-22 DIAGNOSIS — R339 Retention of urine, unspecified: Secondary | ICD-10-CM | POA: Diagnosis not present

## 2022-05-22 DIAGNOSIS — N401 Enlarged prostate with lower urinary tract symptoms: Secondary | ICD-10-CM | POA: Diagnosis not present

## 2022-05-22 DIAGNOSIS — N189 Chronic kidney disease, unspecified: Secondary | ICD-10-CM | POA: Diagnosis not present

## 2022-05-23 ENCOUNTER — Telehealth: Payer: Self-pay

## 2022-05-23 NOTE — Telephone Encounter (Signed)
[place] PHONE. NO ANSWER. LEFT A MESSAGE THERE FOR PATIENT TO CANCEL HIS RETACRIT SHOT. HEMOGLOBIN IS 11.2. I ALSO TRIED TO LEAVE A MESSAGE ON SON'S CELL PHONE BUT HIS MAILBOX IS FULL. I WILL TRY AGAIN IN THE AM.

## 2022-05-24 ENCOUNTER — Inpatient Hospital Stay: Payer: Medicare Other

## 2022-06-05 ENCOUNTER — Other Ambulatory Visit: Payer: Self-pay | Admitting: Oncology

## 2022-06-05 ENCOUNTER — Inpatient Hospital Stay: Payer: Medicare Other | Attending: Oncology

## 2022-06-05 DIAGNOSIS — D539 Nutritional anemia, unspecified: Secondary | ICD-10-CM

## 2022-06-05 DIAGNOSIS — D631 Anemia in chronic kidney disease: Secondary | ICD-10-CM | POA: Insufficient documentation

## 2022-06-05 DIAGNOSIS — N189 Chronic kidney disease, unspecified: Secondary | ICD-10-CM | POA: Diagnosis not present

## 2022-06-05 LAB — CBC WITH DIFFERENTIAL/PLATELET
Abs Immature Granulocytes: 0.02 10*3/uL (ref 0.00–0.07)
Basophils Absolute: 0 10*3/uL (ref 0.0–0.1)
Basophils Relative: 0 %
Eosinophils Absolute: 0.1 10*3/uL (ref 0.0–0.5)
Eosinophils Relative: 1 %
HCT: 34.1 % — ABNORMAL LOW (ref 39.0–52.0)
Hemoglobin: 11.1 g/dL — ABNORMAL LOW (ref 13.0–17.0)
Immature Granulocytes: 0 %
Lymphocytes Relative: 22 %
Lymphs Abs: 1.5 10*3/uL (ref 0.7–4.0)
MCH: 30.5 pg (ref 26.0–34.0)
MCHC: 32.6 g/dL (ref 30.0–36.0)
MCV: 93.7 fL (ref 80.0–100.0)
Monocytes Absolute: 0.6 10*3/uL (ref 0.1–1.0)
Monocytes Relative: 8 %
Neutro Abs: 4.7 10*3/uL (ref 1.7–7.7)
Neutrophils Relative %: 69 %
Platelets: 155 10*3/uL (ref 150–400)
RBC: 3.64 MIL/uL — ABNORMAL LOW (ref 4.22–5.81)
RDW: 12.1 % (ref 11.5–15.5)
WBC: 6.9 10*3/uL (ref 4.0–10.5)
nRBC: 0 % (ref 0.0–0.2)

## 2022-06-05 LAB — BASIC METABOLIC PANEL
Anion gap: 8 (ref 5–15)
BUN: 25 mg/dL — ABNORMAL HIGH (ref 8–23)
CO2: 25 mmol/L (ref 22–32)
Calcium: 9 mg/dL (ref 8.9–10.3)
Chloride: 108 mmol/L (ref 98–111)
Creatinine, Ser: 1.44 mg/dL — ABNORMAL HIGH (ref 0.61–1.24)
GFR, Estimated: 49 mL/min — ABNORMAL LOW (ref >60)
Glucose, Bld: 79 mg/dL (ref 70–99)
Potassium: 3.7 mmol/L (ref 3.5–5.1)
Sodium: 141 mmol/L (ref 135–145)

## 2022-06-05 LAB — FERRITIN: Ferritin: 201 ng/mL (ref 24–336)

## 2022-06-06 ENCOUNTER — Telehealth: Payer: Self-pay | Admitting: Oncology

## 2022-06-06 ENCOUNTER — Encounter: Payer: Self-pay | Admitting: Oncology

## 2022-06-06 NOTE — Telephone Encounter (Signed)
06/06/22 spoke with patient and cancelled injection appt

## 2022-06-06 NOTE — Telephone Encounter (Signed)
06/06/22 left msg injection on 06/07/22 not needed.

## 2022-06-07 ENCOUNTER — Ambulatory Visit: Payer: Medicare Other

## 2022-06-09 DIAGNOSIS — G43909 Migraine, unspecified, not intractable, without status migrainosus: Secondary | ICD-10-CM | POA: Diagnosis not present

## 2022-06-19 ENCOUNTER — Inpatient Hospital Stay: Payer: Medicare Other

## 2022-06-19 DIAGNOSIS — D631 Anemia in chronic kidney disease: Secondary | ICD-10-CM | POA: Diagnosis not present

## 2022-06-19 DIAGNOSIS — N189 Chronic kidney disease, unspecified: Secondary | ICD-10-CM | POA: Diagnosis not present

## 2022-06-19 DIAGNOSIS — R32 Unspecified urinary incontinence: Secondary | ICD-10-CM | POA: Diagnosis not present

## 2022-06-19 DIAGNOSIS — D539 Nutritional anemia, unspecified: Secondary | ICD-10-CM

## 2022-06-19 LAB — CBC WITH DIFFERENTIAL/PLATELET
Abs Immature Granulocytes: 0.01 10*3/uL (ref 0.00–0.07)
Basophils Absolute: 0 10*3/uL (ref 0.0–0.1)
Basophils Relative: 0 %
Eosinophils Absolute: 0.1 10*3/uL (ref 0.0–0.5)
Eosinophils Relative: 2 %
HCT: 32.2 % — ABNORMAL LOW (ref 39.0–52.0)
Hemoglobin: 10.5 g/dL — ABNORMAL LOW (ref 13.0–17.0)
Immature Granulocytes: 0 %
Lymphocytes Relative: 27 %
Lymphs Abs: 1.6 10*3/uL (ref 0.7–4.0)
MCH: 30.5 pg (ref 26.0–34.0)
MCHC: 32.6 g/dL (ref 30.0–36.0)
MCV: 93.6 fL (ref 80.0–100.0)
Monocytes Absolute: 0.4 10*3/uL (ref 0.1–1.0)
Monocytes Relative: 6 %
Neutro Abs: 3.8 10*3/uL (ref 1.7–7.7)
Neutrophils Relative %: 65 %
Platelets: 100 10*3/uL — ABNORMAL LOW (ref 150–400)
RBC: 3.44 MIL/uL — ABNORMAL LOW (ref 4.22–5.81)
RDW: 12.4 % (ref 11.5–15.5)
WBC: 5.8 10*3/uL (ref 4.0–10.5)
nRBC: 0 % (ref 0.0–0.2)

## 2022-06-19 LAB — BASIC METABOLIC PANEL
Anion gap: 7 (ref 5–15)
BUN: 31 mg/dL — ABNORMAL HIGH (ref 8–23)
CO2: 24 mmol/L (ref 22–32)
Calcium: 9.2 mg/dL (ref 8.9–10.3)
Chloride: 109 mmol/L (ref 98–111)
Creatinine, Ser: 1.53 mg/dL — ABNORMAL HIGH (ref 0.61–1.24)
GFR, Estimated: 45 mL/min — ABNORMAL LOW (ref >60)
Glucose, Bld: 92 mg/dL (ref 70–99)
Potassium: 3.9 mmol/L (ref 3.5–5.1)
Sodium: 140 mmol/L (ref 135–145)

## 2022-06-19 LAB — FERRITIN: Ferritin: 178 ng/mL (ref 24–336)

## 2022-06-21 ENCOUNTER — Inpatient Hospital Stay: Payer: Medicare Other

## 2022-06-21 ENCOUNTER — Telehealth: Payer: Self-pay

## 2022-06-21 NOTE — Telephone Encounter (Signed)
CALLED PATIENT AND LEFT A MESSAGE REGARDING HIS RETACRIT APPT. WE WILL CANCEL DUE TO HGB = 10.5.

## 2022-07-03 ENCOUNTER — Inpatient Hospital Stay: Payer: Medicare Other

## 2022-07-03 DIAGNOSIS — N189 Chronic kidney disease, unspecified: Secondary | ICD-10-CM | POA: Diagnosis not present

## 2022-07-03 DIAGNOSIS — D539 Nutritional anemia, unspecified: Secondary | ICD-10-CM

## 2022-07-03 DIAGNOSIS — D631 Anemia in chronic kidney disease: Secondary | ICD-10-CM | POA: Diagnosis not present

## 2022-07-03 LAB — CBC WITH DIFFERENTIAL/PLATELET
Abs Immature Granulocytes: 0 10*3/uL (ref 0.00–0.07)
Basophils Absolute: 0 10*3/uL (ref 0.0–0.1)
Basophils Relative: 1 %
Eosinophils Absolute: 0.2 10*3/uL (ref 0.0–0.5)
Eosinophils Relative: 4 %
HCT: 32.6 % — ABNORMAL LOW (ref 39.0–52.0)
Hemoglobin: 10.4 g/dL — ABNORMAL LOW (ref 13.0–17.0)
Immature Granulocytes: 0 %
Lymphocytes Relative: 39 %
Lymphs Abs: 2.3 10*3/uL (ref 0.7–4.0)
MCH: 30.1 pg (ref 26.0–34.0)
MCHC: 31.9 g/dL (ref 30.0–36.0)
MCV: 94.2 fL (ref 80.0–100.0)
Monocytes Absolute: 0.5 10*3/uL (ref 0.1–1.0)
Monocytes Relative: 9 %
Neutro Abs: 2.9 10*3/uL (ref 1.7–7.7)
Neutrophils Relative %: 47 %
Platelets: 167 10*3/uL (ref 150–400)
RBC: 3.46 MIL/uL — ABNORMAL LOW (ref 4.22–5.81)
RDW: 12.9 % (ref 11.5–15.5)
WBC: 6.1 10*3/uL (ref 4.0–10.5)
nRBC: 0 % (ref 0.0–0.2)

## 2022-07-03 LAB — BASIC METABOLIC PANEL
Anion gap: 10 (ref 5–15)
BUN: 38 mg/dL — ABNORMAL HIGH (ref 8–23)
CO2: 24 mmol/L (ref 22–32)
Calcium: 9.2 mg/dL (ref 8.9–10.3)
Chloride: 107 mmol/L (ref 98–111)
Creatinine, Ser: 1.55 mg/dL — ABNORMAL HIGH (ref 0.61–1.24)
GFR, Estimated: 44 mL/min — ABNORMAL LOW (ref >60)
Glucose, Bld: 84 mg/dL (ref 70–99)
Potassium: 4 mmol/L (ref 3.5–5.1)
Sodium: 141 mmol/L (ref 135–145)

## 2022-07-03 LAB — FERRITIN: Ferritin: 176 ng/mL (ref 24–336)

## 2022-07-05 ENCOUNTER — Inpatient Hospital Stay: Payer: Medicare Other

## 2022-07-05 ENCOUNTER — Telehealth: Payer: Self-pay

## 2022-07-05 NOTE — Telephone Encounter (Signed)
THEY ARE HOLDING THE SHOT UNTIL 07/17/22 WHEN HE GETS A CBC RECHECKED AND SEE THE PROVIDER.

## 2022-07-05 NOTE — Telephone Encounter (Signed)
CALLED PATIENT AGAIN AND LEFT A MESSAGE FOR PATIENT TO CALL ME BACK REGAING HIS SHOT.

## 2022-07-05 NOTE — Telephone Encounter (Signed)
I LEFT A MESSAGE ON [facility] [employer] FOR PATIENT TO CALL PRIOR TO COMING HERE, REGARDING HIS RETACRIT SHOT TODAY. I WILL CALL AGAIN LATER.

## 2022-07-17 ENCOUNTER — Inpatient Hospital Stay: Payer: Medicare Other | Attending: Oncology | Admitting: Oncology

## 2022-07-17 ENCOUNTER — Encounter: Payer: Self-pay | Admitting: Oncology

## 2022-07-17 ENCOUNTER — Inpatient Hospital Stay: Payer: Medicare Other

## 2022-07-17 VITALS — BP 115/63 | HR 61 | Temp 97.9°F | Resp 16 | Ht 66.0 in | Wt 125.6 lb

## 2022-07-17 DIAGNOSIS — D631 Anemia in chronic kidney disease: Secondary | ICD-10-CM | POA: Diagnosis not present

## 2022-07-17 DIAGNOSIS — D539 Nutritional anemia, unspecified: Secondary | ICD-10-CM | POA: Diagnosis not present

## 2022-07-17 DIAGNOSIS — N1832 Chronic kidney disease, stage 3b: Secondary | ICD-10-CM | POA: Insufficient documentation

## 2022-07-17 DIAGNOSIS — R54 Age-related physical debility: Secondary | ICD-10-CM | POA: Diagnosis not present

## 2022-07-17 LAB — BASIC METABOLIC PANEL
Anion gap: 9 (ref 5–15)
BUN: 35 mg/dL — ABNORMAL HIGH (ref 8–23)
CO2: 25 mmol/L (ref 22–32)
Calcium: 9 mg/dL (ref 8.9–10.3)
Chloride: 109 mmol/L (ref 98–111)
Creatinine, Ser: 1.54 mg/dL — ABNORMAL HIGH (ref 0.61–1.24)
GFR, Estimated: 45 mL/min — ABNORMAL LOW (ref >60)
Glucose, Bld: 86 mg/dL (ref 70–99)
Potassium: 4 mmol/L (ref 3.5–5.1)
Sodium: 143 mmol/L (ref 135–145)

## 2022-07-17 LAB — CBC WITH DIFFERENTIAL/PLATELET
Abs Immature Granulocytes: 0.01 10*3/uL (ref 0.00–0.07)
Basophils Absolute: 0 10*3/uL (ref 0.0–0.1)
Basophils Relative: 0 %
Eosinophils Absolute: 0.1 10*3/uL (ref 0.0–0.5)
Eosinophils Relative: 2 %
HCT: 31.1 % — ABNORMAL LOW (ref 39.0–52.0)
Hemoglobin: 9.9 g/dL — ABNORMAL LOW (ref 13.0–17.0)
Immature Granulocytes: 0 %
Lymphocytes Relative: 31 %
Lymphs Abs: 1.7 10*3/uL (ref 0.7–4.0)
MCH: 30.4 pg (ref 26.0–34.0)
MCHC: 31.8 g/dL (ref 30.0–36.0)
MCV: 95.4 fL (ref 80.0–100.0)
Monocytes Absolute: 0.6 10*3/uL (ref 0.1–1.0)
Monocytes Relative: 10 %
Neutro Abs: 3 10*3/uL (ref 1.7–7.7)
Neutrophils Relative %: 57 %
Platelets: 125 10*3/uL — ABNORMAL LOW (ref 150–400)
RBC: 3.26 MIL/uL — ABNORMAL LOW (ref 4.22–5.81)
RDW: 12.7 % (ref 11.5–15.5)
WBC: 5.4 10*3/uL (ref 4.0–10.5)
nRBC: 0 % (ref 0.0–0.2)

## 2022-07-17 LAB — FERRITIN: Ferritin: 192 ng/mL (ref 24–336)

## 2022-07-17 NOTE — Progress Notes (Signed)
[facility] Cancer Follow up Visit:  Patient Care Team: [name], MD as PCP - General (Internal Medicine)  CHIEF COMPLAINTS/PURPOSE OF CONSULTATION:  HISTORY OF PRESENTING ILLNESS:  Joshua Parker 82 y.o. male is here because of anemia Medical history notable for BPH, coronary artery disease, cervical disc disease, CKD, gallstones, hypertension, hypothyroidism, TIA, vitamin D deficiency, right inguinal hernia, vitamin B12 deficiency  March 09, 2022 WBC 7.5 hemoglobin 9.3 MCV 94 platelet count 227 Ferritin 421 creatinine 1.58 (Cr calculated 30)  April 04, 2022: [facility] Hematology Consult  Can not tolerate oral iron therefore received IV iron about 3 months ago.  Has not required PRBC's in the past.  Developed severe constipation from oral iron.      No reaction to IV iron.  Has a regular diet.  No history of hemorrhage postoperatively requiring transfusion.  No hematochezia, melena, hemoptysis, hematuria.   No history of intra-articular or soft tissue bleeding.  Bruises easily.  Uses Tylenol for pain.  Does not use NSAIDS for painI.  Is not taking oral anticoagulants or antiplatelet drugs.  No history of abnormal bleeding in family members Patient has symptoms of fatigue, pallor,  DOE, decreased performance status.  No history of colonoscopy, or egd and wishes to avoid instrumentation  Social:  Did furniture work and Holiday representative.  Tobacco smoked 40 yrs; quit 20 yrs ago.  EtOH none  WBC 7.3 hemoglobin 9.6 MCV 97 platelet count 143; 67 segs 23 lymphs 9 monos 1 EO leukocyte count 1.1% Coombs test negative haptoglobin 161 INR 1.1 PTT 33 SPEP with IEP showed no monoclonal protein.  Serum free kappa 55.4 lambda 42.6 with a kappa lambda 1.30 IgG 1159 IgA 287 IgM 195 PSA 2.26.  H. pylori stool antigen negative Ferritin 182 folate 15.6 B12 487 Copper 100 zinc 68 CMP notable for creatinine 1.69 BUN 44 Testosterone 143  April 12, 2022: Erythropoietin 20,000  units  April 18, 2022:   Reviewed results of labs with patient and son.  Feels better since receiving Epo.  Discussed how Epo works to help with anemia April 26, 2022: EPO 20,000 units  May 16 2022:    Feels well.  Appetite good.  Weight stable.   Energy good.  Son notices that patient is more active  Ferritin 194.  Hemoglobin 11.2  July 03 2022:  Hgb 10.4 Cr 1.55  Jul 17 2022:  Scheduled follow-up for management of anemia. More fatigued.  Patient and son are worried that he may require HD.  Discussed indications for HD which he does not currently meet. Patient saw his father go through HD.  Explained that Epo effect is transient and needs to be re-dosed.      Review of Systems - Oncology  MEDICAL HISTORY: Past Medical History:  Diagnosis Date   Anemia    Anxiety    BPH without obstruction/lower urinary tract symptoms    CAD (coronary artery disease)    Cervical disc disease    CKD (chronic kidney disease), stage III (HCC)    Gallstones    Hypertension    Hypothyroidism    Seizure disorder (HCC)    TIA (transient ischemic attack)    Vitamin B12 deficiency    Vitamin D deficiency     SURGICAL HISTORY: Past Surgical History:  Procedure Laterality Date   BLADDER SURGERY     CATARACT EXTRACTION     CHOLECYSTECTOMY     CYSTOSCOPY WITH INSERTION OF UROLIFT     HERNIA REPAIR  NECK SURGERY     ORIF of Left Arm     Skull Fracture needing surgery     TRANSURETHRAL RESECTION OF PROSTATE      SOCIAL HISTORY: Social History   Socioeconomic History   Marital status: Widowed    Spouse name: Not on file   Number of children: Not on file   Years of education: Not on file   Highest education level: Not on file  Occupational History   Not on file  Tobacco Use   Smoking status: Former   Smokeless tobacco: Former    Types: Associate Professor Use: Never used  Substance and Sexual Activity   Alcohol use: Never   Drug use: Never   Sexual activity: Not  on file  Other Topics Concern   Not on file  Social History Narrative   Not on file   Social Determinants of Health   Financial Resource Strain: Not on file  Food Insecurity: No Food Insecurity (04/04/2022)   Hunger Vital Sign    Worried About Running Out of Food in the Last Year: Never true    Ran Out of Food in the Last Year: Never true  Transportation Needs: No Transportation Needs (04/04/2022)   PRAPARE - Administrator, Civil Service (Medical): No    Lack of Transportation (Non-Medical): No  Physical Activity: Not on file  Stress: Not on file  Social Connections: Not on file  Intimate Partner Violence: Not At Risk (04/04/2022)   Humiliation, Afraid, Rape, and Kick questionnaire    Fear of Current or Ex-Partner: No    Emotionally Abused: No    Physically Abused: No    Sexually Abused: No    FAMILY HISTORY Family History  Problem Relation Age of Onset   Diabetes Mother    Prostate cancer Father    Colon cancer Sister    Hypertension Son    Hypothyroidism Son     ALLERGIES:  is allergic to gabapentin, codeine, iron, topiramate, and valproic acid.  MEDICATIONS:  Current Outpatient Medications  Medication Sig Dispense Refill   acetaminophen (TYLENOL) 500 MG tablet Take 500 mg by mouth every 6 (six) hours as needed.     atorvastatin (LIPITOR) 40 MG tablet Take 40 mg by mouth every other day.     Cholecalciferol (VITAMIN D-3) 125 MCG (5000 UT) TABS Take 1 tablet by mouth daily.     cyanocobalamin (,VITAMIN B-12,) 1000 MCG/ML injection Inject 1,000 mcg into the skin every 30 (thirty) days.     furosemide (LASIX) 40 MG tablet Take 20 mg by mouth daily as needed.     levETIRAcetam (KEPPRA) 500 MG tablet Take 500 mg by mouth 2 (two) times daily.     levothyroxine (SYNTHROID) 88 MCG tablet Take 88 mcg by mouth every morning.     meclizine (ANTIVERT) 12.5 MG tablet Take 12.5 mg by mouth 3 (three) times daily as needed.     methocarbamol (ROBAXIN) 500 MG tablet Take  500 mg by mouth.     Multiple Vitamins-Minerals (PRESERVISION AREDS 2 PO) Take by mouth.     olmesartan (BENICAR) 40 MG tablet Take 20 mg by mouth daily.     ondansetron (ZOFRAN) 4 MG tablet Take 4 mg by mouth.     Rimegepant Sulfate (NURTEC) 75 MG TBDP Take 1 tablet by mouth daily at 12 noon.     silodosin (RAPAFLO) 8 MG CAPS capsule Take by mouth daily with breakfast.  tamsulosin (FLOMAX) 0.4 MG CAPS capsule Take 0.4 mg by mouth 2 (two) times daily.     No current facility-administered medications for this visit.    PHYSICAL EXAMINATION:  ECOG PERFORMANCE STATUS: 2 - Symptomatic, <50% confined to bed   There were no vitals filed for this visit.    There were no vitals filed for this visit.     Physical Exam Vitals and nursing note reviewed.  Constitutional:      Appearance: Normal appearance. He is not toxic-appearing or diaphoretic.     Comments: Thin.  Chronically ill appearing.  Temporal wasting  HENT:     Head: Normocephalic and atraumatic.     Right Ear: External ear normal.     Left Ear: External ear normal.     Nose: Nose normal.  Eyes:     General: No scleral icterus.    Conjunctiva/sclera: Conjunctivae normal.     Pupils: Pupils are equal, round, and reactive to light.  Cardiovascular:     Rate and Rhythm: Regular rhythm. Tachycardia present.     Pulses: Normal pulses.     Heart sounds: Normal heart sounds.     No friction rub. No gallop.  Pulmonary:     Effort: Pulmonary effort is normal. No respiratory distress.     Breath sounds: Normal breath sounds. No stridor. No wheezing or rales.  Abdominal:     General: Abdomen is flat.     Tenderness: There is no abdominal tenderness. There is no guarding or rebound.  Musculoskeletal:        General: No swelling.     Cervical back: Normal range of motion and neck supple. No rigidity or tenderness.     Right lower leg: No edema.     Left lower leg: No edema.     Comments: Decreased muscle mass.  Kyphotic   Lymphadenopathy:     Head:     Right side of head: No submental, submandibular, tonsillar, preauricular, posterior auricular or occipital adenopathy.     Left side of head: No submental, submandibular, tonsillar, preauricular, posterior auricular or occipital adenopathy.     Cervical: No cervical adenopathy.     Right cervical: No superficial, deep or posterior cervical adenopathy.    Left cervical: No superficial, deep or posterior cervical adenopathy.     Upper Body:     Right upper body: No supraclavicular or axillary adenopathy.     Left upper body: No supraclavicular or axillary adenopathy.     Lower Body: No right inguinal adenopathy. No left inguinal adenopathy.  Skin:    Coloration: Skin is not jaundiced or pale.     Findings: Bruising present.  Neurological:     General: No focal deficit present.     Mental Status: He is alert and oriented to person, place, and time.     Comments: Decreased hearing  Psychiatric:        Mood and Affect: Mood normal.        Behavior: Behavior normal.        Thought Content: Thought content normal.        Judgment: Judgment normal.      LABORATORY DATA: I have personally reviewed the data as listed:  Appointment on 07/03/2022  Component Date Value Ref Range Status   Sodium 07/03/2022 141  135 - 145 mmol/L Final   Potassium 07/03/2022 4.0  3.5 - 5.1 mmol/L Final   Chloride 07/03/2022 107  98 - 111 mmol/L Final   CO2 07/03/2022  24  22 - 32 mmol/L Final   Glucose, Bld 07/03/2022 84  70 - 99 mg/dL Final   Glucose reference range applies only to samples taken after fasting for at least 8 hours.   BUN 07/03/2022 38 (H)  8 - 23 mg/dL Final   Creatinine, Ser 07/03/2022 1.55 (H)  0.61 - 1.24 mg/dL Final   Calcium 40/98/1191 9.2  8.9 - 10.3 mg/dL Final   GFR, Estimated 07/03/2022 44 (L)  >60 mL/min Final   Comment: (NOTE) Calculated using the CKD-EPI Creatinine Equation (2021)    Anion gap 07/03/2022 10  5 - 15 Final   Performed at [facility], [address], [place], [place] [address]   Ferritin 07/03/2022 176  24 - 336 ng/mL Final   Performed at [facility], [address] [place], [place] [address]   WBC 07/03/2022 6.1  4.0 - 10.5 K/uL Final   RBC 07/03/2022 3.46 (L)  4.22 - 5.81 MIL/uL Final   Hemoglobin 07/03/2022 10.4 (L)  13.0 - 17.0 g/dL Final   HCT 08/65/7846 32.6 (L)  39.0 - 52.0 % Final   MCV 07/03/2022 94.2  80.0 - 100.0 fL Final   MCH 07/03/2022 30.1  26.0 - 34.0 pg Final   MCHC 07/03/2022 31.9  30.0 - 36.0 g/dL Final   RDW 96/29/5284 12.9  11.5 - 15.5 % Final   Platelets 07/03/2022 167  150 - 400 K/uL Final   nRBC 07/03/2022 0.0  0.0 - 0.2 % Final   Neutrophils Relative % 07/03/2022 47  % Final   Neutro Abs 07/03/2022 2.9  1.7 - 7.7 K/uL Final   Lymphocytes Relative 07/03/2022 39  % Final   Lymphs Abs 07/03/2022 2.3  0.7 - 4.0 K/uL Final   Monocytes Relative 07/03/2022 9  % Final   Monocytes Absolute 07/03/2022 0.5  0.1 - 1.0 K/uL Final   Eosinophils Relative 07/03/2022 4  % Final   Eosinophils Absolute 07/03/2022 0.2  0.0 - 0.5 K/uL Final   Basophils Relative 07/03/2022 1  % Final   Basophils Absolute 07/03/2022 0.0  0.0 - 0.1 K/uL Final   Immature Granulocytes 07/03/2022 0  % Final   Abs Immature Granulocytes 07/03/2022 0.00  0.00 - 0.07 K/uL Final   Performed at [facility], [address], [place], [place] [address]  Appointment on 06/19/2022  Component Date Value Ref Range Status   Sodium 06/19/2022 140  135 - 145 mmol/L Final   Potassium 06/19/2022 3.9  3.5 - 5.1 mmol/L Final   Chloride 06/19/2022 109  98 - 111 mmol/L Final   CO2 06/19/2022 24  22 - 32 mmol/L Final   Glucose, Bld 06/19/2022 92  70 - 99 mg/dL Final   Glucose reference range applies only to samples taken after fasting for at least 8 hours.   BUN 06/19/2022 31 (H)  8 - 23 mg/dL Final   Creatinine, Ser 06/19/2022 1.53 (H)  0.61 - 1.24 mg/dL Final   Calcium 03/08/7251 9.2   8.9 - 10.3 mg/dL Final   GFR, Estimated 06/19/2022 45 (L)  >60 mL/min Final   Comment: (NOTE) Calculated using the CKD-EPI Creatinine Equation (2021)    Anion gap 06/19/2022 7  5 - 15 Final   Performed at [facility], [address], [place], [place] [address]   Ferritin 06/19/2022 178  24 - 336 ng/mL Final   Performed at [facility], [address], [place], [place] [address]  WBC 06/19/2022 5.8  4.0 - 10.5 K/uL Final   RBC 06/19/2022 3.44 (L)  4.22 - 5.81 MIL/uL Final   Hemoglobin 06/19/2022 10.5 (L)  13.0 - 17.0 g/dL Final   HCT 16/12/9602 32.2 (L)  39.0 - 52.0 % Final   MCV 06/19/2022 93.6  80.0 - 100.0 fL Final   MCH 06/19/2022 30.5  26.0 - 34.0 pg Final   MCHC 06/19/2022 32.6  30.0 - 36.0 g/dL Final   RDW 54/11/8117 12.4  11.5 - 15.5 % Final   Platelets 06/19/2022 100 (L)  150 - 400 K/uL Final   nRBC 06/19/2022 0.0  0.0 - 0.2 % Final   Neutrophils Relative % 06/19/2022 65  % Final   Neutro Abs 06/19/2022 3.8  1.7 - 7.7 K/uL Final   Lymphocytes Relative 06/19/2022 27  % Final   Lymphs Abs 06/19/2022 1.6  0.7 - 4.0 K/uL Final   Monocytes Relative 06/19/2022 6  % Final   Monocytes Absolute 06/19/2022 0.4  0.1 - 1.0 K/uL Final   Eosinophils Relative 06/19/2022 2  % Final   Eosinophils Absolute 06/19/2022 0.1  0.0 - 0.5 K/uL Final   Basophils Relative 06/19/2022 0  % Final   Basophils Absolute 06/19/2022 0.0  0.0 - 0.1 K/uL Final   Immature Granulocytes 06/19/2022 0  % Final   Abs Immature Granulocytes 06/19/2022 0.01  0.00 - 0.07 K/uL Final   Performed at [facility], [address] [place], [place] [address]    RADIOGRAPHIC STUDIES: I have personally reviewed the radiological images as listed and agree with the findings in the report  No results found.  ASSESSMENT/PLAN  Anemia:  Multifactorial.  Main contributions are CKD (Cr Clearance 30) and low testosterone level April 12 2021:  Epo 20 000 units.    April 18 2021:  Following Hgb and ferritin levels.  May ultimately require IV iron as stores mobilized to make Hgb.  Holding on testosterone replacement due to thrombotic risk May 16 2022:  Hgb improved and ferritin adequate.  Will continue Epo.   Jul 17 2022- Hgb has trended downward due to recent ruling by CMS which precludes use unless Hgb < 10.0.  Will continue to follow Hgb  Hypothyroidism:  On replacement.  Can contribute to anemia  Bruising:  Can be due to skin fragility.   April 04 2022:   PLT count, PT/PTT are normal  Frail elderly:  Limits tolerance to instrumentation  Chronic kidney disease  Jul 17 2022- Discussed possible causes with patient and son, at their request.  Assured them the he does not meet criteria for requiring HD at this time     Cancer Staging  No matching staging information was found for the patient.   No problem-specific Assessment & Plan notes found for this encounter.   No orders of the defined types were placed in this encounter.  35  minutes was spent in patient care.  This included time spent preparing to see the patient (e.g., review of tests), obtaining and/or reviewing separately obtained history, counseling and educating the patient/family/caregiver, ordering medications, tests, or procedures; documenting clinical information in the electronic or other health record, independently interpreting results and communicating results to the patient/family/caregiver as well as coordination of care.       All questions were answered. The patient knows to call the clinic with any problems, questions or concerns.  This note was electronically signed.    Loni Muse, MD  07/17/2022 2:00 PM

## 2022-07-18 ENCOUNTER — Encounter: Payer: Self-pay | Admitting: Oncology

## 2022-07-19 ENCOUNTER — Inpatient Hospital Stay: Payer: Medicare Other

## 2022-07-19 VITALS — BP 128/72 | HR 58 | Temp 97.3°F | Resp 18 | Ht 66.0 in | Wt 124.3 lb

## 2022-07-19 DIAGNOSIS — N1832 Chronic kidney disease, stage 3b: Secondary | ICD-10-CM | POA: Diagnosis not present

## 2022-07-19 DIAGNOSIS — D631 Anemia in chronic kidney disease: Secondary | ICD-10-CM | POA: Diagnosis not present

## 2022-07-19 DIAGNOSIS — N183 Chronic kidney disease, stage 3 unspecified: Secondary | ICD-10-CM

## 2022-07-19 MED ORDER — EPOETIN ALFA-EPBX 20000 UNIT/ML IJ SOLN
20000.0000 [IU] | Freq: Once | INTRAMUSCULAR | Status: AC
  Administered 2022-07-19: 20000 [IU] via SUBCUTANEOUS
  Filled 2022-07-19: qty 1

## 2022-07-19 NOTE — Patient Instructions (Signed)

## 2022-07-20 DIAGNOSIS — R32 Unspecified urinary incontinence: Secondary | ICD-10-CM | POA: Diagnosis not present

## 2022-07-26 DIAGNOSIS — E039 Hypothyroidism, unspecified: Secondary | ICD-10-CM | POA: Diagnosis not present

## 2022-07-26 DIAGNOSIS — E785 Hyperlipidemia, unspecified: Secondary | ICD-10-CM | POA: Diagnosis not present

## 2022-07-26 DIAGNOSIS — Z6820 Body mass index (BMI) 20.0-20.9, adult: Secondary | ICD-10-CM | POA: Diagnosis not present

## 2022-07-26 DIAGNOSIS — N1832 Chronic kidney disease, stage 3b: Secondary | ICD-10-CM | POA: Diagnosis not present

## 2022-07-26 DIAGNOSIS — Z79899 Other long term (current) drug therapy: Secondary | ICD-10-CM | POA: Diagnosis not present

## 2022-07-26 DIAGNOSIS — D509 Iron deficiency anemia, unspecified: Secondary | ICD-10-CM | POA: Diagnosis not present

## 2022-07-26 DIAGNOSIS — R519 Headache, unspecified: Secondary | ICD-10-CM | POA: Diagnosis not present

## 2022-07-26 DIAGNOSIS — E559 Vitamin D deficiency, unspecified: Secondary | ICD-10-CM | POA: Diagnosis not present

## 2022-07-26 DIAGNOSIS — I10 Essential (primary) hypertension: Secondary | ICD-10-CM | POA: Diagnosis not present

## 2022-07-28 ENCOUNTER — Inpatient Hospital Stay: Payer: Medicare Other

## 2022-07-28 ENCOUNTER — Telehealth: Payer: Self-pay

## 2022-07-28 NOTE — Telephone Encounter (Unsigned)
Pt called to report some of the lab results from PCP (drawn on 07/26/22). PCP is to fax these labs to [place]. Pt's Hgb 10.3, so he will not need Retacrit injection (I cancelled the appt). Also mentioned that his BUN & Cr are elevated. I asked if he has a nephrologist. He replied No. He does see a urologist. I told him Dr Angelene Giovanni is not in clinic here today. I would send this message to him on Tuesday (Mondays is holiday). He states that Dr Angelene Giovanni told them if his kidney labs were high, he would have to rethink the plan.

## 2022-08-02 ENCOUNTER — Ambulatory Visit: Payer: Medicare Other

## 2022-08-03 ENCOUNTER — Inpatient Hospital Stay: Payer: Medicare Other

## 2022-08-03 ENCOUNTER — Other Ambulatory Visit: Payer: Self-pay

## 2022-08-03 ENCOUNTER — Encounter: Payer: Self-pay | Admitting: Oncology

## 2022-08-03 DIAGNOSIS — D631 Anemia in chronic kidney disease: Secondary | ICD-10-CM | POA: Diagnosis not present

## 2022-08-03 DIAGNOSIS — D539 Nutritional anemia, unspecified: Secondary | ICD-10-CM

## 2022-08-03 DIAGNOSIS — N1832 Chronic kidney disease, stage 3b: Secondary | ICD-10-CM | POA: Diagnosis not present

## 2022-08-03 LAB — CBC WITH DIFFERENTIAL (CANCER CENTER ONLY)
Abs Immature Granulocytes: 0.01 10*3/uL (ref 0.00–0.07)
Basophils Absolute: 0 10*3/uL (ref 0.0–0.1)
Basophils Relative: 0 %
Eosinophils Absolute: 0.1 10*3/uL (ref 0.0–0.5)
Eosinophils Relative: 2 %
HCT: 32.8 % — ABNORMAL LOW (ref 39.0–52.0)
Hemoglobin: 10.3 g/dL — ABNORMAL LOW (ref 13.0–17.0)
Immature Granulocytes: 0 %
Lymphocytes Relative: 34 %
Lymphs Abs: 1.5 10*3/uL (ref 0.7–4.0)
MCH: 30.7 pg (ref 26.0–34.0)
MCHC: 31.4 g/dL (ref 30.0–36.0)
MCV: 97.6 fL (ref 80.0–100.0)
Monocytes Absolute: 0.4 10*3/uL (ref 0.1–1.0)
Monocytes Relative: 9 %
Neutro Abs: 2.4 10*3/uL (ref 1.7–7.7)
Neutrophils Relative %: 55 %
Platelet Count: 104 10*3/uL — ABNORMAL LOW (ref 150–400)
RBC: 3.36 MIL/uL — ABNORMAL LOW (ref 4.22–5.81)
RDW: 13.3 % (ref 11.5–15.5)
WBC Count: 4.5 10*3/uL (ref 4.0–10.5)
nRBC: 0 % (ref 0.0–0.2)

## 2022-08-03 LAB — BASIC METABOLIC PANEL
Anion gap: 7 (ref 5–15)
BUN: 38 mg/dL — ABNORMAL HIGH (ref 8–23)
CO2: 25 mmol/L (ref 22–32)
Calcium: 8.8 mg/dL — ABNORMAL LOW (ref 8.9–10.3)
Chloride: 109 mmol/L (ref 98–111)
Creatinine, Ser: 1.68 mg/dL — ABNORMAL HIGH (ref 0.61–1.24)
GFR, Estimated: 40 mL/min — ABNORMAL LOW (ref >60)
Glucose, Bld: 91 mg/dL (ref 70–99)
Potassium: 4.1 mmol/L (ref 3.5–5.1)
Sodium: 141 mmol/L (ref 135–145)

## 2022-08-03 LAB — FERRITIN: Ferritin: 137 ng/mL (ref 24–336)

## 2022-08-14 ENCOUNTER — Inpatient Hospital Stay: Payer: Medicare Other

## 2022-08-14 ENCOUNTER — Encounter: Payer: Self-pay | Admitting: Hematology and Oncology

## 2022-08-14 ENCOUNTER — Inpatient Hospital Stay: Payer: Medicare Other | Attending: Oncology | Admitting: Hematology and Oncology

## 2022-08-14 VITALS — BP 136/67 | HR 53 | Temp 97.6°F | Resp 18 | Ht 66.0 in | Wt 134.3 lb

## 2022-08-14 DIAGNOSIS — Z87891 Personal history of nicotine dependence: Secondary | ICD-10-CM | POA: Diagnosis not present

## 2022-08-14 DIAGNOSIS — Z8042 Family history of malignant neoplasm of prostate: Secondary | ICD-10-CM | POA: Insufficient documentation

## 2022-08-14 DIAGNOSIS — D539 Nutritional anemia, unspecified: Secondary | ICD-10-CM

## 2022-08-14 DIAGNOSIS — E039 Hypothyroidism, unspecified: Secondary | ICD-10-CM | POA: Insufficient documentation

## 2022-08-14 DIAGNOSIS — I2510 Atherosclerotic heart disease of native coronary artery without angina pectoris: Secondary | ICD-10-CM | POA: Diagnosis not present

## 2022-08-14 DIAGNOSIS — E538 Deficiency of other specified B group vitamins: Secondary | ICD-10-CM | POA: Insufficient documentation

## 2022-08-14 DIAGNOSIS — Z79890 Hormone replacement therapy (postmenopausal): Secondary | ICD-10-CM | POA: Diagnosis not present

## 2022-08-14 DIAGNOSIS — Z79899 Other long term (current) drug therapy: Secondary | ICD-10-CM | POA: Insufficient documentation

## 2022-08-14 DIAGNOSIS — R32 Unspecified urinary incontinence: Secondary | ICD-10-CM | POA: Diagnosis not present

## 2022-08-14 DIAGNOSIS — N400 Enlarged prostate without lower urinary tract symptoms: Secondary | ICD-10-CM | POA: Insufficient documentation

## 2022-08-14 DIAGNOSIS — Z8673 Personal history of transient ischemic attack (TIA), and cerebral infarction without residual deficits: Secondary | ICD-10-CM | POA: Insufficient documentation

## 2022-08-14 DIAGNOSIS — Z800 Family history of malignant neoplasm of digestive organs: Secondary | ICD-10-CM | POA: Insufficient documentation

## 2022-08-14 DIAGNOSIS — N1832 Chronic kidney disease, stage 3b: Secondary | ICD-10-CM | POA: Diagnosis not present

## 2022-08-14 DIAGNOSIS — D631 Anemia in chronic kidney disease: Secondary | ICD-10-CM

## 2022-08-14 LAB — CBC WITH DIFFERENTIAL/PLATELET
Abs Immature Granulocytes: 0.01 10*3/uL (ref 0.00–0.07)
Basophils Absolute: 0 10*3/uL (ref 0.0–0.1)
Basophils Relative: 0 %
Eosinophils Absolute: 0.1 10*3/uL (ref 0.0–0.5)
Eosinophils Relative: 2 %
HCT: 32.6 % — ABNORMAL LOW (ref 39.0–52.0)
Hemoglobin: 10.3 g/dL — ABNORMAL LOW (ref 13.0–17.0)
Immature Granulocytes: 0 %
Lymphocytes Relative: 30 %
Lymphs Abs: 1.4 10*3/uL (ref 0.7–4.0)
MCH: 30.4 pg (ref 26.0–34.0)
MCHC: 31.6 g/dL (ref 30.0–36.0)
MCV: 96.2 fL (ref 80.0–100.0)
Monocytes Absolute: 0.4 10*3/uL (ref 0.1–1.0)
Monocytes Relative: 9 %
Neutro Abs: 2.7 10*3/uL (ref 1.7–7.7)
Neutrophils Relative %: 59 %
Platelets: 142 10*3/uL — ABNORMAL LOW (ref 150–400)
RBC: 3.39 MIL/uL — ABNORMAL LOW (ref 4.22–5.81)
RDW: 13.4 % (ref 11.5–15.5)
WBC: 4.7 10*3/uL (ref 4.0–10.5)
nRBC: 0 % (ref 0.0–0.2)

## 2022-08-14 LAB — BASIC METABOLIC PANEL
Anion gap: 9 (ref 5–15)
BUN: 41 mg/dL — ABNORMAL HIGH (ref 8–23)
CO2: 26 mmol/L (ref 22–32)
Calcium: 9.3 mg/dL (ref 8.9–10.3)
Chloride: 102 mmol/L (ref 98–111)
Creatinine, Ser: 1.66 mg/dL — ABNORMAL HIGH (ref 0.61–1.24)
GFR, Estimated: 41 mL/min — ABNORMAL LOW (ref >60)
Glucose, Bld: 90 mg/dL (ref 70–99)
Potassium: 5.1 mmol/L (ref 3.5–5.1)
Sodium: 137 mmol/L (ref 135–145)

## 2022-08-14 LAB — FERRITIN: Ferritin: 155 ng/mL (ref 24–336)

## 2022-08-14 NOTE — Progress Notes (Signed)
[facility] up Visit:  Patient Care Team: Joshua Kingdom, MD as PCP - General (Internal Medicine)  CHIEF COMPLAINTS/PURPOSE OF CONSULTATION:  HISTORY OF PRESENTING ILLNESS:  Joshua Parker 82 y.o. male is here because of anemia Medical history notable for BPH, coronary artery disease, cervical disc disease, CKD, gallstones, hypertension, hypothyroidism, TIA, vitamin D deficiency, right inguinal hernia, vitamin B12 deficiency  March 09, 2022 WBC 7.5 hemoglobin 9.3 MCV 94 platelet count 227 Ferritin 421 creatinine 1.58 (Cr calculated 30)  April 04, 2022: [facility]  Can not tolerate oral iron therefore received IV iron about 3 months ago.  Has not required PRBC's in the past.  Developed severe constipation from oral iron.      No reaction to IV iron.  Has a regular diet.  No history of hemorrhage postoperatively requiring transfusion.  No hematochezia, melena, hemoptysis, hematuria.   No history of intra-articular or soft tissue bleeding.  Bruises easily.  Uses Tylenol for pain.  Does not use NSAIDS for pain.  Is not taking oral anticoagulants or antiplatelet drugs.  No history of abnormal bleeding in family members Patient has symptoms of fatigue, pallor,  DOE, decreased performance status.  No history of colonoscopy, or EGD and wishes to avoid instrumentation  Social:  Did furniture work and Holiday representative.  Tobacco smoked 40 yrs; quit 20 yrs ago.  EtOH none  WBC 7.3 hemoglobin 9.6 MCV 97 platelet count 143; 67 segs 23 lymphs 9 monos 1 EO leukocyte count 1.1% Coombs test negative haptoglobin 161 INR 1.1 PTT 33 SPEP with IEP showed no monoclonal protein.  Serum free kappa 55.4 lambda 42.6 with a kappa lambda 1.30 IgG 1159 IgA 287 IgM 195 PSA 2.26.  H. pylori stool antigen negative Ferritin 182 folate 15.6 B12 487 Copper 100 zinc 68 CMP notable for creatinine 1.69 BUN 44 Testosterone 143  April 12, 2022: Erythropoietin 20,000  units  April 18, 2022:   Reviewed results of labs with patient and son.  Feels better since receiving Epo.  Discussed how Epo works to help with anemia April 26, 2022: EPO 20,000 units  May 16 2022:    Feels well.  Appetite good.  Weight stable.   Energy good.  Son notices that patient is more active  Ferritin 194.  Hemoglobin 11.2  July 03 2022:  Hgb 10.4 Cr 1.55  Jul 17 2022:  Scheduled follow-up for management of anemia. More fatigued.  Patient and son are worried that he may require HD.  Discussed indications for HD which he does not currently meet. Patient saw his father go through HD.  Explained that Epo effect is transient and needs to be re-dosed.  Hemoglobin 9.9  Jul 19, 2022: Epo 20,000 units  Aug 03, 2022: Hemoglobin 10.3, creatinine 1.68  August 14, 2022: Scheduled follow-up for management of anemia.  Worsening fatigue and general weakness despite the improvement in his hemoglobin with Epo.  The patient's son would like a referral to nephrology. Hemoglobin 10.3.   Review of Systems  Constitutional:  Positive for fatigue. Negative for appetite change, chills, fever and unexpected weight change.  HENT:   Negative for lump/mass, mouth sores and sore throat.   Respiratory:  Negative for cough and shortness of breath.   Cardiovascular:  Negative for chest pain and leg swelling.  Gastrointestinal:  Negative for abdominal pain, constipation, diarrhea, nausea and vomiting.  Genitourinary:  Negative for difficulty urinating, dysuria, frequency and hematuria.   Musculoskeletal:  Positive for neck  pain. Negative for arthralgias, back pain and myalgias.  Skin:  Negative for itching, rash and wound.  Neurological:  Negative for dizziness, extremity weakness, headaches, light-headedness and numbness.  Hematological:  Negative for adenopathy.  Psychiatric/Behavioral:  Positive for sleep disturbance. Negative for depression. The patient is not nervous/anxious.     MEDICAL  HISTORY: Past Medical History:  Diagnosis Date   Anemia    Anxiety    BPH without obstruction/lower urinary tract symptoms    CAD (coronary artery disease)    Cervical disc disease    CKD (chronic kidney disease), stage III (HCC)    Gallstones    Hypertension    Hypothyroidism    Seizure disorder (HCC)    TIA (transient ischemic attack)    Vitamin B12 deficiency    Vitamin D deficiency     SURGICAL HISTORY: Past Surgical History:  Procedure Laterality Date   BLADDER SURGERY     CATARACT EXTRACTION     CHOLECYSTECTOMY     CYSTOSCOPY WITH INSERTION OF UROLIFT     HERNIA REPAIR     NECK SURGERY     ORIF of Left Arm     Skull Fracture needing surgery     TRANSURETHRAL RESECTION OF PROSTATE      SOCIAL HISTORY: Social History   Socioeconomic History   Marital status: Widowed    Spouse name: Not on file   Number of children: Not on file   Years of education: Not on file   Highest education level: Not on file  Occupational History   Not on file  Tobacco Use   Smoking status: Former   Smokeless tobacco: Former    Types: Associate Professor Use: Never used  Substance and Sexual Activity   Alcohol use: Never   Drug use: Never   Sexual activity: Not on file  Other Topics Concern   Not on file  Social History Narrative   Not on file   Social Determinants of Health   Financial Resource Strain: Not on file  Food Insecurity: No Food Insecurity (04/04/2022)   Hunger Vital Sign    Worried About Running Out of Food in the Last Year: Never true    Ran Out of Food in the Last Year: Never true  Transportation Needs: No Transportation Needs (04/04/2022)   PRAPARE - Administrator, Civil Service (Medical): No    Lack of Transportation (Non-Medical): No  Physical Activity: Not on file  Stress: Not on file  Social Connections: Not on file  Intimate Partner Violence: Not At Risk (04/04/2022)   Humiliation, Afraid, Rape, and Kick questionnaire    Fear of  Current or Ex-Partner: No    Emotionally Abused: No    Physically Abused: No    Sexually Abused: No    FAMILY HISTORY Family History  Problem Relation Age of Onset   Diabetes Mother    Prostate cancer Father    Colon cancer Sister    Hypertension Son    Hypothyroidism Son     ALLERGIES:  is allergic to gabapentin, codeine, iron, topiramate, and valproic acid.  MEDICATIONS:  Current Outpatient Medications  Medication Sig Dispense Refill   acetaminophen (TYLENOL) 500 MG tablet Take 500 mg by mouth every 6 (six) hours as needed.     atorvastatin (LIPITOR) 40 MG tablet Take 40 mg by mouth every other day.     Cholecalciferol (VITAMIN D-3) 125 MCG (5000 UT) TABS Take 1 tablet by mouth daily.  cyanocobalamin (,VITAMIN B-12,) 1000 MCG/ML injection Inject 1,000 mcg into the skin every 30 (thirty) days.     furosemide (LASIX) 40 MG tablet Take 20 mg by mouth daily as needed.     levETIRAcetam (KEPPRA) 500 MG tablet Take 500 mg by mouth 2 (two) times daily.     levothyroxine (SYNTHROID) 88 MCG tablet Take 88 mcg by mouth every morning.     meclizine (ANTIVERT) 12.5 MG tablet Take 12.5 mg by mouth 3 (three) times daily as needed.     methocarbamol (ROBAXIN) 500 MG tablet Take 500 mg by mouth.     Multiple Vitamins-Minerals (PRESERVISION AREDS 2 PO) Take by mouth.     olmesartan (BENICAR) 40 MG tablet Take 20 mg by mouth daily.     ondansetron (ZOFRAN) 4 MG tablet Take 4 mg by mouth.     Rimegepant Sulfate (NURTEC) 75 MG TBDP Take 1 tablet by mouth daily at 12 noon.     tamsulosin (FLOMAX) 0.4 MG CAPS capsule Take 0.4 mg by mouth 2 (two) times daily.     No current facility-administered medications for this visit.    PHYSICAL EXAMINATION:  ECOG PERFORMANCE STATUS: 2 - Symptomatic, <50% confined to bed   Vitals:   08/14/22 1349 08/14/22 1350  BP: (!) 143/63 136/67  Pulse: (!) 53   Resp: 18   Temp: 97.6 F (36.4 C)   SpO2: 97%      Filed Weights   08/14/22 1349  Weight:  134 lb 4.8 oz (60.9 kg)      Physical Exam Vitals reviewed.  Constitutional:      General: He is not in acute distress.    Appearance: He is ill-appearing (Chronically ill-appearing).     Comments: Weak appearing older gentleman in no acute distress.  Hearing is decreased.  HENT:     Head:     Comments: Temporal wasting    Mouth/Throat:     Mouth: Mucous membranes are moist.     Pharynx: Oropharynx is clear.  Eyes:     General: No scleral icterus. Cardiovascular:     Rate and Rhythm: Regular rhythm. Bradycardia present.     Heart sounds: Normal heart sounds. No murmur heard.    No friction rub. No gallop.  Pulmonary:     Effort: Pulmonary effort is normal. No respiratory distress.     Breath sounds: Normal breath sounds. No stridor. No wheezing, rhonchi or rales.  Abdominal:     General: Bowel sounds are normal. There is no distension.     Palpations: Abdomen is soft. There is no mass.     Tenderness: There is no abdominal tenderness.  Musculoskeletal:        General: Normal range of motion.     Cervical back: Normal range of motion.     Right lower leg: No edema.     Left lower leg: No edema.     Comments: Decreased muscle mass.  Kyphotic  Lymphadenopathy:     Cervical: No cervical adenopathy.  Skin:    General: Skin is warm and dry.     Coloration: Skin is not jaundiced.     Findings: No rash.  Neurological:     Mental Status: He is alert and oriented to person, place, and time.     Cranial Nerves: No cranial nerve deficit.  Psychiatric:        Mood and Affect: Mood normal.        Thought Content: Thought content normal.  Judgment: Judgment normal.      LABORATORY DATA: I have personally reviewed the data as listed:  Appointment on 08/14/2022  Component Date Value Ref Range Status   Ferritin 08/14/2022 155  24 - 336 ng/mL Final   Performed at [facility], [address] [place], [place] [address]   Sodium 08/14/2022 137  135 -  145 mmol/L Final   Potassium 08/14/2022 5.1  3.5 - 5.1 mmol/L Final   Chloride 08/14/2022 102  98 - 111 mmol/L Final   CO2 08/14/2022 26  22 - 32 mmol/L Final   Glucose, Bld 08/14/2022 90  70 - 99 mg/dL Final   Glucose reference range applies only to samples taken after fasting for at least 8 hours.   BUN 08/14/2022 41 (H)  8 - 23 mg/dL Final   Creatinine, Ser 08/14/2022 1.66 (H)  0.61 - 1.24 mg/dL Final   Calcium 82/95/6213 9.3  8.9 - 10.3 mg/dL Final   GFR, Estimated 08/14/2022 41 (L)  >60 mL/min Final   Comment: (NOTE) Calculated using the CKD-EPI Creatinine Equation (2021)    Anion gap 08/14/2022 9  5 - 15 Final   Performed at [facility], [address] [place], [place] [address]   WBC 08/14/2022 4.7  4.0 - 10.5 K/uL Final   RBC 08/14/2022 3.39 (L)  4.22 - 5.81 MIL/uL Final   Hemoglobin 08/14/2022 10.3 (L)  13.0 - 17.0 g/dL Final   HCT 84/69/6295 32.6 (L)  39.0 - 52.0 % Final   MCV 08/14/2022 96.2  80.0 - 100.0 fL Final   MCH 08/14/2022 30.4  26.0 - 34.0 pg Final   MCHC 08/14/2022 31.6  30.0 - 36.0 g/dL Final   RDW 28/41/3244 13.4  11.5 - 15.5 % Final   Platelets 08/14/2022 142 (L)  150 - 400 K/uL Final   nRBC 08/14/2022 0.0  0.0 - 0.2 % Final   Neutrophils Relative % 08/14/2022 59  % Final   Neutro Abs 08/14/2022 2.7  1.7 - 7.7 K/uL Final   Lymphocytes Relative 08/14/2022 30  % Final   Lymphs Abs 08/14/2022 1.4  0.7 - 4.0 K/uL Final   Monocytes Relative 08/14/2022 9  % Final   Monocytes Absolute 08/14/2022 0.4  0.1 - 1.0 K/uL Final   Eosinophils Relative 08/14/2022 2  % Final   Eosinophils Absolute 08/14/2022 0.1  0.0 - 0.5 K/uL Final   Basophils Relative 08/14/2022 0  % Final   Basophils Absolute 08/14/2022 0.0  0.0 - 0.1 K/uL Final   Immature Granulocytes 08/14/2022 0  % Final   Abs Immature Granulocytes 08/14/2022 0.01  0.00 - 0.07 K/uL Final   Performed at [facility], [address] [place], [place] [address]  Appointment on  08/03/2022  Component Date Value Ref Range Status   Ferritin 08/03/2022 137  24 - 336 ng/mL Final   Performed at [facility], [address], [place], [place] [address]   WBC Count 08/03/2022 4.5  4.0 - 10.5 K/uL Final   RBC 08/03/2022 3.36 (L)  4.22 - 5.81 MIL/uL Final   Hemoglobin 08/03/2022 10.3 (L)  13.0 - 17.0 g/dL Final   HCT 44/05/4740 32.8 (L)  39.0 - 52.0 % Final   MCV 08/03/2022 97.6  80.0 - 100.0 fL Final   MCH 08/03/2022 30.7  26.0 - 34.0 pg Final   MCHC 08/03/2022 31.4  30.0 - 36.0 g/dL Final   RDW 59/56/3875 13.3  11.5 - 15.5 %  Final   Platelet Count 08/03/2022 104 (L)  150 - 400 K/uL Final   nRBC 08/03/2022 0.0  0.0 - 0.2 % Final   Neutrophils Relative % 08/03/2022 55  % Final   Neutro Abs 08/03/2022 2.4  1.7 - 7.7 K/uL Final   Lymphocytes Relative 08/03/2022 34  % Final   Lymphs Abs 08/03/2022 1.5  0.7 - 4.0 K/uL Final   Monocytes Relative 08/03/2022 9  % Final   Monocytes Absolute 08/03/2022 0.4  0.1 - 1.0 K/uL Final   Eosinophils Relative 08/03/2022 2  % Final   Eosinophils Absolute 08/03/2022 0.1  0.0 - 0.5 K/uL Final   Basophils Relative 08/03/2022 0  % Final   Basophils Absolute 08/03/2022 0.0  0.0 - 0.1 K/uL Final   Immature Granulocytes 08/03/2022 0  % Final   Abs Immature Granulocytes 08/03/2022 0.01  0.00 - 0.07 K/uL Final   Performed at [facility], [address], [place], [place] [address]   Sodium 08/03/2022 141  135 - 145 mmol/L Final   Potassium 08/03/2022 4.1  3.5 - 5.1 mmol/L Final   Chloride 08/03/2022 109  98 - 111 mmol/L Final   CO2 08/03/2022 25  22 - 32 mmol/L Final   Glucose, Bld 08/03/2022 91  70 - 99 mg/dL Final   Glucose reference range applies only to samples taken after fasting for at least 8 hours.   BUN 08/03/2022 38 (H)  8 - 23 mg/dL Final   Creatinine, Ser 08/03/2022 1.68 (H)  0.61 - 1.24 mg/dL Final   Calcium 60/45/4098 8.8 (L)  8.9 - 10.3 mg/dL Final   GFR, Estimated 08/03/2022 40 (L)  >60  mL/min Final   Comment: (NOTE) Calculated using the CKD-EPI Creatinine Equation (2021)    Anion gap 08/03/2022 7  5 - 15 Final   Performed at [facility], [address] [place], [place] [address]  Appointment on 07/17/2022  Component Date Value Ref Range Status   Ferritin 07/17/2022 192  24 - 336 ng/mL Final   Performed at [facility], [address] [place], [place] [address]   Sodium 07/17/2022 143  135 - 145 mmol/L Final   Potassium 07/17/2022 4.0  3.5 - 5.1 mmol/L Final   Chloride 07/17/2022 109  98 - 111 mmol/L Final   CO2 07/17/2022 25  22 - 32 mmol/L Final   Glucose, Bld 07/17/2022 86  70 - 99 mg/dL Final   Glucose reference range applies only to samples taken after fasting for at least 8 hours.   BUN 07/17/2022 35 (H)  8 - 23 mg/dL Final   Creatinine, Ser 07/17/2022 1.54 (H)  0.61 - 1.24 mg/dL Final   Calcium 62/13/0865 9.0  8.9 - 10.3 mg/dL Final   GFR, Estimated 07/17/2022 45 (L)  >60 mL/min Final   Comment: (NOTE) Calculated using the CKD-EPI Creatinine Equation (2021)    Anion gap 07/17/2022 9  5 - 15 Final   Performed at [facility], [address] [place], [place] [address]   WBC 07/17/2022 5.4  4.0 - 10.5 K/uL Final   RBC 07/17/2022 3.26 (L)  4.22 - 5.81 MIL/uL Final   Hemoglobin 07/17/2022 9.9 (L)  13.0 - 17.0 g/dL Final   HCT 62/95/2841 31.1 (L)  39.0 - 52.0 % Final   MCV 07/17/2022 95.4  80.0 - 100.0 fL Final   MCH 07/17/2022 30.4  26.0 - 34.0 pg Final   MCHC 07/17/2022 31.8  30.0 -  36.0 g/dL Final   RDW 16/12/9602 12.7  11.5 - 15.5 % Final   Platelets 07/17/2022 125 (L)  150 - 400 K/uL Final   nRBC 07/17/2022 0.0  0.0 - 0.2 % Final   Neutrophils Relative % 07/17/2022 57  % Final   Neutro Abs 07/17/2022 3.0  1.7 - 7.7 K/uL Final   Lymphocytes Relative 07/17/2022 31  % Final   Lymphs Abs 07/17/2022 1.7  0.7 - 4.0 K/uL Final   Monocytes Relative 07/17/2022 10  % Final   Monocytes Absolute  07/17/2022 0.6  0.1 - 1.0 K/uL Final   Eosinophils Relative 07/17/2022 2  % Final   Eosinophils Absolute 07/17/2022 0.1  0.0 - 0.5 K/uL Final   Basophils Relative 07/17/2022 0  % Final   Basophils Absolute 07/17/2022 0.0  0.0 - 0.1 K/uL Final   Immature Granulocytes 07/17/2022 0  % Final   Abs Immature Granulocytes 07/17/2022 0.01  0.00 - 0.07 K/uL Final   Performed at [facility], [address], [place], [place] [address]    RADIOGRAPHIC STUDIES: I have personally reviewed the radiological images as listed and agree with the findings in the report  No results found.  ASSESSMENT/PLAN  Anemia:  Multifactorial.  Main contributions are CKD (Cr Clearance 30) and low testosterone level April 12 2021:  Epo 20 000 units.   April 18 2021:  Following Hgb and ferritin levels.  May ultimately require IV iron as stores mobilized to make Hgb.  Holding on testosterone replacement due to thrombotic risk May 16 2022:  Hgb improved and ferritin adequate.  Will continue Epo.   Jul 17 2022- Hgb has trended downward due to recent ruling by CMS which precludes use unless Hgb < 10.0.  Hgb 9.9 Jul 19, 2022-Epo 20,000 units May 30, 20204-HgB 10.3 August 14, 2022-HgB 10.3, ferritin 155 will continue to monitor  Hypothyroidism:  On replacement.  Can contribute to anemia  Bruising:  Can be due to skin fragility.   April 04 2022:   PLT count, PT/PTT are normal  Frail elderly:  Limits tolerance to instrumentation  Chronic kidney disease  Jul 17 2022- Discussed possible causes with patient and son, at their request.  Assured them the he does not meet criteria for requiring HD at this time  August 14, 2022- Creatinine stable. Referral to Dr[facility] 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 Patient has seen cardiology in the past. Will refer back due to increasing fatigue and  weakness     Cancer Staging  No matching staging information was found for the patient.   No problem-specific Assessment & Plan notes found for this encounter.   No orders of the defined types were placed in this encounter.  35  minutes was spent in patient care.  This included time spent preparing to see the patient (e.g., review of tests), obtaining and/or reviewing separately obtained history, counseling and educating the patient/family/caregiver, ordering medications, tests, or procedures; documenting clinical information in the electronic or other health record, independently interpreting results and communicating results to the patient/family/caregiver as well as coordination of care.       All questions were answered. The patient knows to call the clinic with any problems, questions or concerns.  This note was electronically signed.    Adah Perl, PA-C  08/14/2022 9:00 PM

## 2022-08-15 ENCOUNTER — Telehealth: Payer: Self-pay | Admitting: Oncology

## 2022-08-15 ENCOUNTER — Telehealth: Payer: Self-pay

## 2022-08-15 ENCOUNTER — Telehealth: Payer: Self-pay | Admitting: Cardiology

## 2022-08-15 NOTE — Telephone Encounter (Signed)
Spoke with Evette at [facility] office , they will call and get patient scheduled to evaluate.

## 2022-08-15 NOTE — Telephone Encounter (Signed)
Contacted pt to schedule an appt. Unable to reach via phone, voicemail was left.    Scheduling Message Entered by Belva Crome A on 08/14/2022 at  8:53 PM Priority: Routine <No visit type provided>  Department: [facility]  Provider:  Scheduling Notes:  1. Labs in 2 weeks  2. F/U and labs with Dr. Angelene Giovanni in 4 weeks

## 2022-08-15 NOTE — Telephone Encounter (Signed)
-----   Message from Adah Perl, PA-C sent at 08/14/2022  2:59 PM EDT ----- Please refer to Dr. Ria Bush, nephrology at [facility] for chronic kidney disease. Thanks

## 2022-08-15 NOTE — Addendum Note (Signed)
Addended by: Lianne Bushy on: 08/15/2022 04:50 PM   Modules accepted: Orders

## 2022-08-15 NOTE — Telephone Encounter (Signed)
Referral faxed to Dr Ria Bush @ [facility] fax # [phone] phone #[phone]

## 2022-08-15 NOTE — Telephone Encounter (Signed)
Received called from a nurse at [facility] stating Tresa Endo, NP spoke with Dr. Dulce Sellar last night about scheduling this patient for a follow up visit for Bradycardia, fatigue and weakness.  Patient was called and scheduled for 6/14 at 10am with Wallis Bamberg.  Patient is aware of appt and office location.

## 2022-08-15 NOTE — Telephone Encounter (Signed)
-----   Message from Adah Perl, PA-C sent at 08/14/2022  9:21 PM EDT ----- Please schedule appt with Dr. Hulen Shouts office regarding bradycardia, prev heart block and worsening fatigue and weakness. Thanks

## 2022-08-16 DIAGNOSIS — N400 Enlarged prostate without lower urinary tract symptoms: Secondary | ICD-10-CM | POA: Insufficient documentation

## 2022-08-16 DIAGNOSIS — E559 Vitamin D deficiency, unspecified: Secondary | ICD-10-CM | POA: Insufficient documentation

## 2022-08-16 DIAGNOSIS — E538 Deficiency of other specified B group vitamins: Secondary | ICD-10-CM | POA: Insufficient documentation

## 2022-08-16 DIAGNOSIS — F419 Anxiety disorder, unspecified: Secondary | ICD-10-CM | POA: Insufficient documentation

## 2022-08-18 ENCOUNTER — Encounter: Payer: Self-pay | Admitting: Cardiology

## 2022-08-18 ENCOUNTER — Ambulatory Visit: Payer: Medicare Other | Attending: Cardiology | Admitting: Cardiology

## 2022-08-18 ENCOUNTER — Ambulatory Visit (INDEPENDENT_AMBULATORY_CARE_PROVIDER_SITE_OTHER): Payer: Medicare Other

## 2022-08-18 VITALS — BP 132/78 | HR 55 | Ht 68.0 in | Wt 134.4 lb

## 2022-08-18 DIAGNOSIS — R001 Bradycardia, unspecified: Secondary | ICD-10-CM

## 2022-08-18 DIAGNOSIS — N183 Chronic kidney disease, stage 3 unspecified: Secondary | ICD-10-CM

## 2022-08-18 DIAGNOSIS — D638 Anemia in other chronic diseases classified elsewhere: Secondary | ICD-10-CM

## 2022-08-18 DIAGNOSIS — I10 Essential (primary) hypertension: Secondary | ICD-10-CM

## 2022-08-18 DIAGNOSIS — R5383 Other fatigue: Secondary | ICD-10-CM

## 2022-08-18 NOTE — Progress Notes (Signed)
Patient and Son  walked in after cardiology appointment.  C/O weakness, fatigue and dizziness, the same aliments reported to cardiologist.  EKG performed at cardiology office showed bradycardia. Heart monitor place for 14 days to evaluate condition of heart.  I explained that there isn't anything we could prescribe or administer to improve his weakness at this time, they needed to finish the heart evaluation first to hopefully find a resolution to his bradycardia which could be the cause of his fatigue and weakness.  They both acknowledged an understanding.

## 2022-08-18 NOTE — Progress Notes (Signed)
Cardiology Office Note:    Date:  08/18/2022   ID:  Joshua Parker, DOB [birth year], MRN [id number]  PCP:  Joshua Kingdom, MD   Joshua Parker Cardiologist:  None     Referring MD: Joshua Kingdom, MD   CC: fatigue  History of Present Illness:    Joshua Parker is a 82 y.o. male with a hx of TIAs, hypertension, hypothyroidism, seizure disorder, CKD, BPH former tobacco abuse quit 20 years ago.  Most recently evaluated by Joshua Parker on 09/29/2019, he was stable from a cardiac perspective.  Was recently evaluated in the cancer center and noted to be bradycardic.  EKG on file for review from 2021 indicated he was in sinus bradycardia at this time, heart rate 56 bpm.  Lab work on 1624 revealed sodium 137, potassium 5.1, creatinine 1.66, GFR 41, ferritin 155, RBCs 3.39, hemoglobin 10.3, hematocrit 32.6, platelets 142.  He presents today accompanied son for follow-up of his fatigue and bradycardia.  He is following with a hematologist for anemia, and was noted to be bradycardic in our office.  EKG in office today reveals sinus bradycardia heart rate 55 bpm, consistent with EKG from years ago.  He states the fatigue has been occurring for approximately a year. He denies chest pain, palpitations, dyspnea, pnd, orthopnea, n, v, dizziness, syncope, edema, weight gain, or early satiety.    Past Medical History:  Diagnosis Date   Anemia    Anxiety    BPH without obstruction/lower urinary tract symptoms    CAD (coronary artery disease)    Cervical disc disease    CKD (chronic kidney disease), stage III (HCC)    Gallstones    Hypertension    Hypothyroidism    Seizure disorder (HCC)    TIA (transient ischemic attack)    Vitamin B12 deficiency    Vitamin D deficiency     Past Surgical History:  Procedure Laterality Date   BLADDER SURGERY     CATARACT EXTRACTION     CHOLECYSTECTOMY     CYSTOSCOPY WITH INSERTION OF UROLIFT     HERNIA REPAIR     NECK SURGERY      ORIF of Left Arm     Skull Fracture needing surgery     TRANSURETHRAL RESECTION OF PROSTATE      Current Medications: Current Meds  Medication Sig   acetaminophen (TYLENOL) 500 MG tablet Take 500 mg by mouth every 6 (six) hours as needed.   atorvastatin (LIPITOR) 40 MG tablet Take 40 mg by mouth every other day.   Cholecalciferol (VITAMIN D-3) 125 MCG (5000 UT) TABS Take 1 tablet by mouth daily.   cyanocobalamin (,VITAMIN B-12,) 1000 MCG/ML injection Inject 1,000 mcg into the skin every 30 (thirty) days.   levETIRAcetam (KEPPRA) 500 MG tablet Take 500 mg by mouth 2 (two) times daily.   levothyroxine (SYNTHROID) 88 MCG tablet Take 88 mcg by mouth every morning.   meclizine (ANTIVERT) 12.5 MG tablet Take 12.5 mg by mouth 3 (three) times daily as needed for dizziness.   methocarbamol (ROBAXIN) 500 MG tablet Take 500 mg by mouth.   Multiple Vitamins-Minerals (PRESERVISION AREDS 2 PO) Take by mouth.   olmesartan (BENICAR) 40 MG tablet Take 20 mg by mouth daily.   ondansetron (ZOFRAN) 4 MG tablet Take 4 mg by mouth every 8 (eight) hours as needed for nausea or vomiting.   Rimegepant Sulfate (NURTEC) 75 MG TBDP Take 1 tablet by mouth daily at 12 noon.   silodosin (RAPAFLO)  8 MG CAPS capsule Take 8 mg by mouth daily with breakfast.   tamsulosin (FLOMAX) 0.4 MG CAPS capsule Take 0.4 mg by mouth 2 (two) times daily.     Allergies:   Gabapentin, Codeine, Iron, Topiramate, and Valproic acid   Social History   Socioeconomic History   Marital status: Widowed    Spouse name: Not on file   Number of children: Not on file   Years of education: Not on file   Highest education level: Not on file  Occupational History   Not on file  Tobacco Use   Smoking status: Former   Smokeless tobacco: Former    Types: Associate Professor Use: Never used  Substance and Sexual Activity   Alcohol use: Never   Drug use: Never   Sexual activity: Not on file  Other Topics Concern   Not on file   Social History Narrative   Not on file   Social Determinants of Health   Financial Resource Strain: Not on file  Food Insecurity: No Food Insecurity (04/04/2022)   Hunger Vital Sign    Worried About Running Out of Food in the Last Year: Never true    Ran Out of Food in the Last Year: Never true  Transportation Needs: No Transportation Needs (04/04/2022)   PRAPARE - Administrator, Civil Service (Medical): No    Lack of Transportation (Non-Medical): No  Physical Activity: Not on file  Stress: Not on file  Social Connections: Not on file     Family History: The patient's family history includes Colon cancer in his sister; Diabetes in his mother; Hypertension in his son; Hypothyroidism in his son; Prostate cancer in his father.  ROS:   Please see the history of present illness.    All other systems reviewed and are negative.  EKGs/Labs/Other Studies Reviewed:    The following studies were reviewed today:      EKG:  EKG is  ordered today.  The ekg ordered today demonstrates sinus bradycardia, heart rate 55 bpm, consistent with prior EKG tracings.  Recent Labs: 04/04/2022: ALT 29 08/14/2022: BUN 41; Creatinine, Ser 1.66; Hemoglobin 10.3; Platelets 142; Potassium 5.1; Sodium 137  Recent Lipid Panel No results found for: "CHOL", "TRIG", "HDL", "CHOLHDL", "VLDL", "LDLCALC", "LDLDIRECT"   Risk Assessment/Calculations:                Physical Exam:    VS:  BP 132/78 (BP Location: Left Arm, Patient Position: Sitting, Cuff Size: Normal)   Pulse (!) 55   Ht 5\' 8"  (1.727 m)   Wt 134 lb 6.4 oz (61 kg)   SpO2 98%   BMI 20.44 kg/m     Wt Readings from Last 3 Encounters:  08/18/22 134 lb 6.4 oz (61 kg)  08/14/22 134 lb 4.8 oz (60.9 kg)  07/19/22 124 lb 5 oz (56.4 kg)     GEN: Frail, thin, well developed in no acute distress HEENT: Normal NECK: No JVD; No carotid bruits LYMPHATICS: No lymphadenopathy CARDIAC: RRR, no murmurs, rubs, gallops RESPIRATORY:  Clear  to auscultation without rales, wheezing or rhonchi  ABDOMEN: Soft, non-tender, non-distended MUSCULOSKELETAL:  No edema; No deformity  SKIN: Warm and dry NEUROLOGIC:  Alert and oriented x 3 PSYCHIATRIC:  Normal affect   ASSESSMENT:    1. Bradycardia   2. Essential hypertension   3. Other fatigue   4. Stage 3 chronic kidney disease, unspecified whether stage 3a or 3b CKD (HCC)  5. Anemia of chronic disease    PLAN:    In order of problems listed above:  Bradycardia-heart rate in office is 55 bpm today, this is not a new finding for him however he has been bothered by fatigue for approximately a year.  Will arrange for 2-week monitor to assess for any pauses or arrhythmias.  Avoid nodal blocking agents. Hypertension-blood pressure in office is 132/78 today.  His olmesartan was recently stopped secondary to kidney dysfunction. CKD stage IIIa/anemia of chronic disease-being followed by hematology, he has recently been referred to nephrology, they are waiting to hear from their office.  Fatigue-likely multifactorial related to CKD, chronic anemia.  Will check TSH as it appears this has not been done in a few months for any contributing causes.  Followed closely by hematology for his anemia, most recent CBC revealed stable anemia, most recent BMET shows stable kidney dysfunction.  Disposition-2-week monitor, TSH. Follow up in 6 weeks.             Medication Adjustments/Labs and Tests Ordered: Current medicines are reviewed at length with the patient today.  Concerns regarding medicines are outlined above.  Orders Placed This Encounter  Procedures   TSH   LONG TERM MONITOR (3-14 DAYS)   EKG 12-Lead   No orders of the defined types were placed in this encounter.   Patient Instructions  Medication Instructions:  Your physician recommends that you continue on your current medications as directed. Please refer to the Current Medication list given to you today.  *If you need a  refill on your cardiac medications before your next appointment, please call your pharmacy*   Lab Work: Your physician recommends that you return for lab work in: Today for TSH  If you have labs (blood work) drawn today and your tests are completely normal, you will receive your results only by: MyChart Message (if you have MyChart) OR A paper copy in the mail If you have any lab test that is abnormal or we need to change your treatment, we will call you to review the results.   Testing/Procedures: You have been asked to wear a Zio Heart Monitor today. It is to be worn for 14 days. Please remove the monitor on 6/28  and mail back in the box provided.  If you have any questions about the monitor please call the company at [phone]     Follow-Up: At [facility], you and your health needs are our priority.  As part of our continuing mission to provide you with exceptional heart care, we have created designated Provider Care Teams.  These Care Teams include your primary Cardiologist (physician) and Advanced [facility] (APPs -  Physician Assistants and Nurse Practitioners) who all work together to provide you with the care you need, when you need it.  We recommend signing up for the patient portal called "MyChart".  Sign up information is provided on this After Visit Summary.  MyChart is used to connect with patients for Virtual Visits (Telemedicine).  Patients are able to view lab/test results, encounter notes, upcoming appointments, etc.  Non-urgent messages can be sent to your provider as well.   To learn more about what you can do with MyChart, go to [email or web address].    Your next appointment:   6 week(s)  Provider:   Wallis Bamberg, NP [facility])    Other Instructions      Signed, Flossie Dibble, NP  08/18/2022 1:01 PM    [facility]

## 2022-08-18 NOTE — Patient Instructions (Addendum)
Medication Instructions:  Your physician recommends that you continue on your current medications as directed. Please refer to the Current Medication list given to you today.  *If you need a refill on your cardiac medications before your next appointment, please call your pharmacy*   Lab Work: Your physician recommends that you return for lab work in: Today for TSH  If you have labs (blood work) drawn today and your tests are completely normal, you will receive your results only by: MyChart Message (if you have MyChart) OR A paper copy in the mail If you have any lab test that is abnormal or we need to change your treatment, we will call you to review the results.   Testing/Procedures: You have been asked to wear a Zio Heart Monitor today. It is to be worn for 14 days. Please remove the monitor on 6/28  and mail back in the box provided.  If you have any questions about the monitor please call the company at [phone]     Follow-Up: At [facility], you and your health needs are our priority.  As part of our continuing mission to provide you with exceptional heart care, we have created designated Provider Care Teams.  These Care Teams include your primary Cardiologist (physician) and Advanced Practice Providers (APPs -  Physician Assistants and Nurse Practitioners) who all work together to provide you with the care you need, when you need it.  We recommend signing up for the patient portal called "MyChart".  Sign up information is provided on this After Visit Summary.  MyChart is used to connect with patients for Virtual Visits (Telemedicine).  Patients are able to view lab/test results, encounter notes, upcoming appointments, etc.  Non-urgent messages can be sent to your provider as well.   To learn more about what you can do with MyChart, go to [email or web address].    Your next appointment:   6 week(s)  Provider:   Wallis Bamberg, NP [facility])    Other  Instructions

## 2022-08-19 LAB — TSH: TSH: 2.95 u[IU]/mL (ref 0.450–4.500)

## 2022-08-28 ENCOUNTER — Inpatient Hospital Stay: Payer: Medicare Other

## 2022-08-28 DIAGNOSIS — E538 Deficiency of other specified B group vitamins: Secondary | ICD-10-CM | POA: Diagnosis not present

## 2022-08-28 DIAGNOSIS — I2510 Atherosclerotic heart disease of native coronary artery without angina pectoris: Secondary | ICD-10-CM | POA: Diagnosis not present

## 2022-08-28 DIAGNOSIS — D631 Anemia in chronic kidney disease: Secondary | ICD-10-CM | POA: Diagnosis not present

## 2022-08-28 DIAGNOSIS — N1832 Chronic kidney disease, stage 3b: Secondary | ICD-10-CM | POA: Diagnosis not present

## 2022-08-28 DIAGNOSIS — E039 Hypothyroidism, unspecified: Secondary | ICD-10-CM | POA: Diagnosis not present

## 2022-08-28 DIAGNOSIS — D539 Nutritional anemia, unspecified: Secondary | ICD-10-CM

## 2022-08-28 DIAGNOSIS — N400 Enlarged prostate without lower urinary tract symptoms: Secondary | ICD-10-CM | POA: Diagnosis not present

## 2022-08-28 LAB — CBC WITH DIFFERENTIAL/PLATELET
Abs Immature Granulocytes: 0.05 10*3/uL (ref 0.00–0.07)
Basophils Absolute: 0 10*3/uL (ref 0.0–0.1)
Basophils Relative: 0 %
Eosinophils Absolute: 0.1 10*3/uL (ref 0.0–0.5)
Eosinophils Relative: 2 %
HCT: 31.5 % — ABNORMAL LOW (ref 39.0–52.0)
Hemoglobin: 10 g/dL — ABNORMAL LOW (ref 13.0–17.0)
Immature Granulocytes: 1 %
Lymphocytes Relative: 33 %
Lymphs Abs: 2.1 10*3/uL (ref 0.7–4.0)
MCH: 30.4 pg (ref 26.0–34.0)
MCHC: 31.7 g/dL (ref 30.0–36.0)
MCV: 95.7 fL (ref 80.0–100.0)
Monocytes Absolute: 0.6 10*3/uL (ref 0.1–1.0)
Monocytes Relative: 9 %
Neutro Abs: 3.5 10*3/uL (ref 1.7–7.7)
Neutrophils Relative %: 55 %
Platelets: 128 10*3/uL — ABNORMAL LOW (ref 150–400)
RBC: 3.29 MIL/uL — ABNORMAL LOW (ref 4.22–5.81)
RDW: 13.1 % (ref 11.5–15.5)
WBC: 6.4 10*3/uL (ref 4.0–10.5)
nRBC: 0 % (ref 0.0–0.2)

## 2022-08-28 LAB — BASIC METABOLIC PANEL
Anion gap: 8 (ref 5–15)
BUN: 46 mg/dL — ABNORMAL HIGH (ref 8–23)
CO2: 24 mmol/L (ref 22–32)
Calcium: 9.4 mg/dL (ref 8.9–10.3)
Chloride: 110 mmol/L (ref 98–111)
Creatinine, Ser: 1.7 mg/dL — ABNORMAL HIGH (ref 0.61–1.24)
GFR, Estimated: 40 mL/min — ABNORMAL LOW (ref >60)
Glucose, Bld: 91 mg/dL (ref 70–99)
Potassium: 4.4 mmol/L (ref 3.5–5.1)
Sodium: 142 mmol/L (ref 135–145)

## 2022-08-28 LAB — FERRITIN: Ferritin: 189 ng/mL (ref 24–336)

## 2022-08-29 ENCOUNTER — Telehealth: Payer: Self-pay

## 2022-08-29 DIAGNOSIS — R0609 Other forms of dyspnea: Secondary | ICD-10-CM

## 2022-08-29 NOTE — Telephone Encounter (Signed)
Echo ordered per Wallis Bamberg, NP

## 2022-09-11 DIAGNOSIS — I10 Essential (primary) hypertension: Secondary | ICD-10-CM | POA: Diagnosis not present

## 2022-09-11 DIAGNOSIS — R32 Unspecified urinary incontinence: Secondary | ICD-10-CM | POA: Diagnosis not present

## 2022-09-11 DIAGNOSIS — R001 Bradycardia, unspecified: Secondary | ICD-10-CM | POA: Diagnosis not present

## 2022-09-21 ENCOUNTER — Telehealth: Payer: Self-pay | Admitting: *Deleted

## 2022-09-21 ENCOUNTER — Encounter: Payer: Self-pay | Admitting: *Deleted

## 2022-09-21 MED ORDER — DILTIAZEM HCL ER COATED BEADS 120 MG PO CP24: 120.0000 mg | ORAL_CAPSULE | Freq: Every day | ORAL | 3 refills | Status: AC

## 2022-09-21 NOTE — Telephone Encounter (Signed)
-----   Message from Flossie Dibble sent at 09/21/2022  9:08 AM EDT ----- Monitor shows that you are having some extra beats originating from the top part of your heart. This is not likely to cause any fatigue, but you may feel your heart skipping beats/racing. We do not need to treat this with medicine, unless you are very bothered by the symptoms (again, not contributing to the fatigue). If you do want [place] to treat with medications, we can start Cardizem 120 mg every day.

## 2022-09-21 NOTE — Telephone Encounter (Signed)
Left message to call us back for monitor results.

## 2022-09-21 NOTE — Telephone Encounter (Signed)
Sent in Cardizem 120mg  to pt's pharmacy: [facility] in [place]. Sent pt a MyChart message

## 2022-09-25 ENCOUNTER — Inpatient Hospital Stay (INDEPENDENT_AMBULATORY_CARE_PROVIDER_SITE_OTHER): Payer: Medicare Other | Admitting: Oncology

## 2022-09-25 ENCOUNTER — Encounter: Payer: Self-pay | Admitting: Oncology

## 2022-09-25 ENCOUNTER — Inpatient Hospital Stay: Payer: Medicare Other | Attending: Oncology

## 2022-09-25 VITALS — BP 157/85 | HR 71 | Temp 97.8°F | Resp 18 | Ht 68.0 in | Wt 127.0 lb

## 2022-09-25 DIAGNOSIS — R54 Age-related physical debility: Secondary | ICD-10-CM

## 2022-09-25 DIAGNOSIS — D539 Nutritional anemia, unspecified: Secondary | ICD-10-CM

## 2022-09-25 DIAGNOSIS — N1832 Chronic kidney disease, stage 3b: Secondary | ICD-10-CM | POA: Diagnosis not present

## 2022-09-25 DIAGNOSIS — I2510 Atherosclerotic heart disease of native coronary artery without angina pectoris: Secondary | ICD-10-CM | POA: Diagnosis not present

## 2022-09-25 DIAGNOSIS — D631 Anemia in chronic kidney disease: Secondary | ICD-10-CM | POA: Insufficient documentation

## 2022-09-25 DIAGNOSIS — Z79899 Other long term (current) drug therapy: Secondary | ICD-10-CM | POA: Diagnosis not present

## 2022-09-25 DIAGNOSIS — E538 Deficiency of other specified B group vitamins: Secondary | ICD-10-CM | POA: Diagnosis not present

## 2022-09-25 LAB — COMPREHENSIVE METABOLIC PANEL
ALT: 35 U/L (ref 0–44)
AST: 29 U/L (ref 15–41)
Albumin: 4.2 g/dL (ref 3.5–5.0)
Alkaline Phosphatase: 80 U/L (ref 38–126)
Anion gap: 10 (ref 5–15)
BUN: 31 mg/dL — ABNORMAL HIGH (ref 8–23)
CO2: 21 mmol/L — ABNORMAL LOW (ref 22–32)
Calcium: 9.2 mg/dL (ref 8.9–10.3)
Chloride: 110 mmol/L (ref 98–111)
Creatinine, Ser: 1.72 mg/dL — ABNORMAL HIGH (ref 0.61–1.24)
GFR, Estimated: 39 mL/min — ABNORMAL LOW (ref >60)
Glucose, Bld: 92 mg/dL (ref 70–99)
Potassium: 4.4 mmol/L (ref 3.5–5.1)
Sodium: 141 mmol/L (ref 135–145)
Total Bilirubin: 0.5 mg/dL (ref 0.3–1.2)
Total Protein: 7.4 g/dL (ref 6.5–8.1)

## 2022-09-25 LAB — CBC WITH DIFFERENTIAL/PLATELET
Abs Immature Granulocytes: 0.07 10*3/uL (ref 0.00–0.07)
Basophils Absolute: 0 10*3/uL (ref 0.0–0.1)
Basophils Relative: 0 %
Eosinophils Absolute: 0.1 10*3/uL (ref 0.0–0.5)
Eosinophils Relative: 1 %
HCT: 31.1 % — ABNORMAL LOW (ref 39.0–52.0)
Hemoglobin: 10 g/dL — ABNORMAL LOW (ref 13.0–17.0)
Immature Granulocytes: 1 %
Lymphocytes Relative: 25 %
Lymphs Abs: 1.3 10*3/uL (ref 0.7–4.0)
MCH: 30.9 pg (ref 26.0–34.0)
MCHC: 32.2 g/dL (ref 30.0–36.0)
MCV: 96 fL (ref 80.0–100.0)
Monocytes Absolute: 0.3 10*3/uL (ref 0.1–1.0)
Monocytes Relative: 5 %
Neutro Abs: 3.7 10*3/uL (ref 1.7–7.7)
Neutrophils Relative %: 68 %
Platelets: 155 10*3/uL (ref 150–400)
RBC: 3.24 MIL/uL — ABNORMAL LOW (ref 4.22–5.81)
RDW: 12.7 % (ref 11.5–15.5)
WBC: 5.4 10*3/uL (ref 4.0–10.5)
nRBC: 0 % (ref 0.0–0.2)

## 2022-09-25 LAB — FOLATE: Folate: 27.5 ng/mL (ref >5.9)

## 2022-09-25 LAB — FERRITIN: Ferritin: 196 ng/mL (ref 24–336)

## 2022-09-25 LAB — VITAMIN B12: Vitamin B-12: 627 pg/mL (ref 180–914)

## 2022-09-25 NOTE — Progress Notes (Signed)
[facility] Cancer Follow up Visit:  Patient Care Team: [name], MD as PCP - General (Internal Medicine) Baldo Daub, MD as PCP - Cardiology (Cardiology)  CHIEF COMPLAINTS/PURPOSE OF CONSULTATION:  HISTORY OF PRESENTING ILLNESS:  Joshua Parker 82 y.o. male is here because of anemia Medical history notable for BPH, coronary artery disease, cervical disc disease, CKD, gallstones, hypertension, hypothyroidism, TIA, vitamin D deficiency, right inguinal hernia, vitamin B12 deficiency  March 09, 2022 WBC 7.5 hemoglobin 9.3 MCV 94 platelet count 227 Ferritin 421 creatinine 1.58 (Cr calculated 30)  April 04, 2022: [facility] Hematology Consult  Can not tolerate oral iron therefore received IV iron about 3 months ago.  Has not required PRBC's in the past.  Developed severe constipation from oral iron.      No reaction to IV iron.  Has a regular diet.  No history of hemorrhage postoperatively requiring transfusion.  No hematochezia, melena, hemoptysis, hematuria.   No history of intra-articular or soft tissue bleeding.  Bruises easily.  Uses Tylenol for pain.  Does not use NSAIDS for painI.  Is not taking oral anticoagulants or antiplatelet drugs.  No history of abnormal bleeding in family members Patient has symptoms of fatigue, pallor,  DOE, decreased performance status.  No history of colonoscopy, or egd and wishes to avoid instrumentation  Social:  Did furniture work and Holiday representative.  Tobacco smoked 40 yrs; quit 20 yrs ago.  EtOH none  WBC 7.3 hemoglobin 9.6 MCV 97 platelet count 143; 67 segs 23 lymphs 9 monos 1 EO leukocyte count 1.1% Coombs test negative haptoglobin 161 INR 1.1 PTT 33 SPEP with IEP showed no monoclonal protein.  Serum free kappa 55.4 lambda 42.6 with a kappa lambda 1.30 IgG 1159 IgA 287 IgM 195 PSA 2.26.  H. pylori stool antigen negative Ferritin 182 folate 15.6 B12 487 Copper 100 zinc 68 CMP notable for creatinine 1.69 BUN  44 Testosterone 143  April 12, 2022: Erythropoietin 20,000 units  April 18, 2022:   Reviewed results of labs with patient and son.  Feels better since receiving Epo.  Discussed how Epo works to help with anemia April 26, 2022: EPO 20,000 units  May 16 2022:    Feels well.  Appetite good.  Weight stable.   Energy good.  Son notices that patient is more active  Ferritin 194.  Hemoglobin 11.2  July 03 2022:  Hgb 10.4 Cr 1.55  Jul 17 2022:  Marland Kitchen More fatigued.  Patient and son are worried that he may require HD.  Discussed indications for HD which he does not currently meet. Patient saw his father go through HD.  Explained that Epo effect is transient and needs to be re-dosed.    Hemoglobin 9.9 creatinine 1.54  Jul 19 2022: Erythropoietin 20,000 units  August 28, 2022: Hemoglobin 10.0 creatinine 1.7  September 25 2022:  Scheduled follow-up for management of anemia.  Has some pretty good days and some pretty bad days.    Taking a MVI with iron and eating meat in the hope that it improves his energy.  Bad days are usually related to HA's and arthritis.  Has started Cardizem this week per cardiology to manage arrhythmias.    September 28 2022:  Nephrology Consult   Review of Systems - Oncology  MEDICAL HISTORY: Past Medical History:  Diagnosis Date   Anemia    Anxiety    BPH without obstruction/lower urinary tract symptoms    CAD (coronary artery disease)  Cervical disc disease    CKD (chronic kidney disease), stage III (HCC)    Gallstones    Hypertension    Hypothyroidism    Seizure disorder (HCC)    TIA (transient ischemic attack)    Vitamin B12 deficiency    Vitamin D deficiency     SURGICAL HISTORY: Past Surgical History:  Procedure Laterality Date   BLADDER SURGERY     CATARACT EXTRACTION     CHOLECYSTECTOMY     CYSTOSCOPY WITH INSERTION OF UROLIFT     HERNIA REPAIR     NECK SURGERY     ORIF of Left Arm     Skull Fracture needing surgery     TRANSURETHRAL  RESECTION OF PROSTATE      SOCIAL HISTORY: Social History   Socioeconomic History   Marital status: Widowed    Spouse name: Not on file   Number of children: Not on file   Years of education: Not on file   Highest education level: Not on file  Occupational History   Not on file  Tobacco Use   Smoking status: Former   Smokeless tobacco: Former    Types: Designer, multimedia Use   Vaping status: Never Used  Substance and Sexual Activity   Alcohol use: Never   Drug use: Never   Sexual activity: Not on file  Other Topics Concern   Not on file  Social History Narrative   Not on file   Social Determinants of Health   Financial Resource Strain: Not on file  Food Insecurity: No Food Insecurity (04/04/2022)   Hunger Vital Sign    Worried About Running Out of Food in the Last Year: Never true    Ran Out of Food in the Last Year: Never true  Transportation Needs: No Transportation Needs (04/04/2022)   PRAPARE - Administrator, Civil Service (Medical): No    Lack of Transportation (Non-Medical): No  Physical Activity: Not on file  Stress: Not on file  Social Connections: Not on file  Intimate Partner Violence: Not At Risk (04/04/2022)   Humiliation, Afraid, Rape, and Kick questionnaire    Fear of Current or Ex-Partner: No    Emotionally Abused: No    Physically Abused: No    Sexually Abused: No    FAMILY HISTORY Family History  Problem Relation Age of Onset   Diabetes Mother    Prostate cancer Father    Colon cancer Sister    Hypertension Son    Hypothyroidism Son     ALLERGIES:  is allergic to gabapentin, codeine, iron, topiramate, and valproic acid.  MEDICATIONS:  Current Outpatient Medications  Medication Sig Dispense Refill   acetaminophen (TYLENOL) 500 MG tablet Take 500 mg by mouth every 6 (six) hours as needed.     atorvastatin (LIPITOR) 40 MG tablet Take 40 mg by mouth every other day.     Cholecalciferol (VITAMIN D-3) 125 MCG (5000 UT) TABS Take 1  tablet by mouth daily.     cyanocobalamin (,VITAMIN B-12,) 1000 MCG/ML injection Inject 1,000 mcg into the skin every 30 (thirty) days.     diltiazem (CARDIZEM CD) 120 MG 24 hr capsule Take 1 capsule (120 mg total) by mouth daily. 90 capsule 3   levETIRAcetam (KEPPRA) 500 MG tablet Take 500 mg by mouth 2 (two) times daily.     levothyroxine (SYNTHROID) 88 MCG tablet Take 88 mcg by mouth every morning.     meclizine (ANTIVERT) 12.5 MG tablet Take 12.5 mg by  mouth 3 (three) times daily as needed for dizziness.     methocarbamol (ROBAXIN) 500 MG tablet Take 500 mg by mouth.     Multiple Vitamins-Minerals (PRESERVISION AREDS 2 PO) Take by mouth.     olmesartan (BENICAR) 40 MG tablet Take 20 mg by mouth daily.     ondansetron (ZOFRAN) 4 MG tablet Take 4 mg by mouth every 8 (eight) hours as needed for nausea or vomiting.     Rimegepant Sulfate (NURTEC) 75 MG TBDP Take 1 tablet by mouth daily at 12 noon.     silodosin (RAPAFLO) 8 MG CAPS capsule Take 8 mg by mouth daily with breakfast.     tamsulosin (FLOMAX) 0.4 MG CAPS capsule Take 0.4 mg by mouth 2 (two) times daily.     No current facility-administered medications for this visit.    PHYSICAL EXAMINATION:  ECOG PERFORMANCE STATUS: 2 - Symptomatic, <50% confined to bed   There were no vitals filed for this visit.    There were no vitals filed for this visit.     Physical Exam Vitals and nursing note reviewed.  Constitutional:      Appearance: Normal appearance. He is not toxic-appearing or diaphoretic.     Comments: Thin.  Chronically ill appearing.  Temporal wasting  HENT:     Head: Normocephalic and atraumatic.     Right Ear: External ear normal.     Left Ear: External ear normal.     Nose: Nose normal.  Eyes:     General: No scleral icterus.    Conjunctiva/sclera: Conjunctivae normal.     Pupils: Pupils are equal, round, and reactive to light.  Cardiovascular:     Rate and Rhythm: Regular rhythm. Tachycardia present.      Pulses: Normal pulses.     Heart sounds: Normal heart sounds.     No friction rub. No gallop.  Pulmonary:     Effort: Pulmonary effort is normal. No respiratory distress.     Breath sounds: Normal breath sounds. No stridor. No wheezing or rales.  Abdominal:     General: Abdomen is flat.     Tenderness: There is no abdominal tenderness. There is no guarding or rebound.  Musculoskeletal:        General: No swelling.     Cervical back: Normal range of motion and neck supple. No rigidity or tenderness.     Right lower leg: No edema.     Left lower leg: No edema.     Comments: Decreased muscle mass.  Kyphotic  Lymphadenopathy:     Head:     Right side of head: No submental, submandibular, tonsillar, preauricular, posterior auricular or occipital adenopathy.     Left side of head: No submental, submandibular, tonsillar, preauricular, posterior auricular or occipital adenopathy.     Cervical: No cervical adenopathy.     Right cervical: No superficial, deep or posterior cervical adenopathy.    Left cervical: No superficial, deep or posterior cervical adenopathy.     Upper Body:     Right upper body: No supraclavicular or axillary adenopathy.     Left upper body: No supraclavicular or axillary adenopathy.     Lower Body: No right inguinal adenopathy. No left inguinal adenopathy.  Skin:    Coloration: Skin is not jaundiced or pale.     Findings: Bruising present.  Neurological:     General: No focal deficit present.     Mental Status: He is alert and oriented to person, place, and time.  Comments: Decreased hearing  Psychiatric:        Mood and Affect: Mood normal.        Behavior: Behavior normal.        Thought Content: Thought content normal.        Judgment: Judgment normal.      LABORATORY DATA: I have personally reviewed the data as listed:  Appointment on 08/28/2022  Component Date Value Ref Range Status   Ferritin 08/28/2022 189  24 - 336 ng/mL Final   Performed at  [facility], [address], [place], [place] [address]   Sodium 08/28/2022 142  135 - 145 mmol/L Final   Potassium 08/28/2022 4.4  3.5 - 5.1 mmol/L Final   Chloride 08/28/2022 110  98 - 111 mmol/L Final   CO2 08/28/2022 24  22 - 32 mmol/L Final   Glucose, Bld 08/28/2022 91  70 - 99 mg/dL Final   Glucose reference range applies only to samples taken after fasting for at least 8 hours.   BUN 08/28/2022 46 (H)  8 - 23 mg/dL Final   Creatinine, Ser 08/28/2022 1.70 (H)  0.61 - 1.24 mg/dL Final   Calcium 41/32/4401 9.4  8.9 - 10.3 mg/dL Final   GFR, Estimated 08/28/2022 40 (L)  >60 mL/min Final   Comment: (NOTE) Calculated using the CKD-EPI Creatinine Equation (2021)    Anion gap 08/28/2022 8  5 - 15 Final   Performed at [facility], [address], [place], [place] [address]   WBC 08/28/2022 6.4  4.0 - 10.5 K/uL Final   RBC 08/28/2022 3.29 (L)  4.22 - 5.81 MIL/uL Final   Hemoglobin 08/28/2022 10.0 (L)  13.0 - 17.0 g/dL Final   HCT 36/64/4034 31.5 (L)  39.0 - 52.0 % Final   MCV 08/28/2022 95.7  80.0 - 100.0 fL Final   MCH 08/28/2022 30.4  26.0 - 34.0 pg Final   MCHC 08/28/2022 31.7  30.0 - 36.0 g/dL Final   RDW 74/25/9563 13.1  11.5 - 15.5 % Final   Platelets 08/28/2022 128 (L)  150 - 400 K/uL Final   nRBC 08/28/2022 0.0  0.0 - 0.2 % Final   Neutrophils Relative % 08/28/2022 55  % Final   Neutro Abs 08/28/2022 3.5  1.7 - 7.7 K/uL Final   Lymphocytes Relative 08/28/2022 33  % Final   Lymphs Abs 08/28/2022 2.1  0.7 - 4.0 K/uL Final   Monocytes Relative 08/28/2022 9  % Final   Monocytes Absolute 08/28/2022 0.6  0.1 - 1.0 K/uL Final   Eosinophils Relative 08/28/2022 2  % Final   Eosinophils Absolute 08/28/2022 0.1  0.0 - 0.5 K/uL Final   Basophils Relative 08/28/2022 0  % Final   Basophils Absolute 08/28/2022 0.0  0.0 - 0.1 K/uL Final   Immature Granulocytes 08/28/2022 1  % Final   Abs Immature Granulocytes 08/28/2022 0.05  0.00 - 0.07 K/uL Final    Performed at [facility], [address] [place], [place] [address]    RADIOGRAPHIC STUDIES: I have personally reviewed the radiological images as listed and agree with the findings in the report  No results found.  ASSESSMENT/PLAN  Anemia:  Multifactorial.  Main contributions are CKD (Cr Clearance 30) and low testosterone level April 12 2021:  Epo 20 000 units.   April 18 2021:  Following Hgb and ferritin levels.  May ultimately require IV iron as stores mobilized to make Hgb.  Holding on testosterone replacement due to  thrombotic risk May 16 2022:  Hgb improved and ferritin adequate.  Will continue Epo.   Jul 17 2022- Hgb has trended downward due to recent ruling by CMS which precludes use unless Hgb < 10.0.  Will continue to follow Hgb Jul 19 2022- Erythropoietin 20,000 units  September 20 2022- Will check CBC with diff today and monthly.  Only requiring Epo intermittently  Hypothyroidism:  On replacement.  Can contribute to anemia  Bruising:  Can be due to skin fragility.   April 04 2022:   PLT count, PT/PTT are normal  Frail elderly:  Limits tolerance to instrumentation  September 20 2022- Discussed various causes of fatigue with patient and son- anemia, cardiac, CKD,  chronic pain.    Chronic kidney disease  Jul 17 2022- Discussed possible causes with patient and son, at their request.  Assured them the he does not meet criteria for requiring HD at this time  September 28 2022- Nephrology Consult     Cancer Staging  No matching staging information was found for the patient.    No problem-specific Assessment & Plan notes found for this encounter.   No orders of the defined types were placed in this encounter.  30  minutes was spent in patient care.  This included time spent preparing to see the patient (e.g., review of tests), obtaining and/or reviewing separately obtained history, counseling and educating the patient/family/caregiver, ordering  medications, tests, or procedures; documenting clinical information in the electronic or other health record, independently interpreting results and communicating results to the patient/family/caregiver as well as coordination of care.       All questions were answered. The patient knows to call the clinic with any problems, questions or concerns.  This note was electronically signed.    Loni Muse, MD  09/25/2022 1:13 PM

## 2022-09-26 ENCOUNTER — Ambulatory Visit: Payer: Medicare Other

## 2022-09-28 ENCOUNTER — Ambulatory Visit: Payer: Medicare Other | Admitting: Cardiology

## 2022-09-28 DIAGNOSIS — N1832 Chronic kidney disease, stage 3b: Secondary | ICD-10-CM | POA: Diagnosis not present

## 2022-10-02 ENCOUNTER — Ambulatory Visit: Payer: Medicare Other | Attending: Cardiology

## 2022-10-02 ENCOUNTER — Telehealth: Payer: Self-pay | Admitting: *Deleted

## 2022-10-02 DIAGNOSIS — R0609 Other forms of dyspnea: Secondary | ICD-10-CM | POA: Diagnosis present

## 2022-10-02 LAB — ECHOCARDIOGRAM COMPLETE
P 1/2 time: 802 ms
S' Lateral: 2.5 cm

## 2022-10-02 NOTE — Telephone Encounter (Signed)
Left message for pt to call back for results.

## 2022-10-02 NOTE — Telephone Encounter (Signed)
-----   Message from Alver Sorrow sent at 10/02/2022  1:14 PM EDT ----- Echocardiogram with normal heart pumping function.  Heart muscle mildly stiff and thick which is expected with age.  Mild leaking of the aortic valve.  We prevent this from worsening by keeping blood pressure well-controlled.  No significant abnormalities that would contribute to fatigue, dyspnea.

## 2022-10-03 NOTE — Telephone Encounter (Signed)
Left message for pt to call us back. 

## 2022-10-16 DIAGNOSIS — R32 Unspecified urinary incontinence: Secondary | ICD-10-CM | POA: Diagnosis not present

## 2022-10-18 NOTE — Progress Notes (Signed)
 " Cardiology Office Note:    Date:  10/19/2022   ID:  DAMONI Parker, DOB 05/22/1940, MRN [id number]  PCP:  Jefferey Fitch, MD   [facility] Providers Cardiologist:  Redell Leiter, MD     Referring MD: Jefferey Fitch, MD   CC: fatigue  History of Present Illness:    Joshua Parker is a 82 y.o. male with a hx of TIAs, hypertension, hypothyroidism, seizure disorder, CKD, BPH former tobacco abuse quit 20 years ago.  Most recently evaluated by Dr. Leiter on 09/29/2019, he was stable from a cardiac perspective.  Was recently evaluated in the cancer center and noted to be bradycardic.  EKG on file for review from 2021 indicated he was in sinus bradycardia at this time, heart rate 56 bpm.  Lab work on 1624 revealed sodium 137, potassium 5.1, creatinine 1.66, GFR 41, ferritin 155, RBCs 3.39, hemoglobin 10.3, hematocrit 32.6, platelets 142.  Evaluated 08/18/2022 for bradycardia and fatigue.  We repeated a monitor on 08/18/2022 revealing an average heart rate of 63 bpm, predominant rhythm was sinus, no pauses, 10 episodes of SVT.  Echocardiogram was repeated on 10/02/2022 revealing EF 55 to 60%, mild concentric LVH, grade 1 DD, mild AR.  We did start him on Cardizem  for his SVT.  He presents today accompanied by son for follow-up of his bradycardia and fatigue.  He offers no formal complaints, states he feels much better since starting the Cardizem  and is not as plagued with fatigue anymore. He denies chest pain, palpitations, dyspnea, pnd, orthopnea, n, v, dizziness, syncope, edema, weight gain, or early satiety.   Past Medical History:  Diagnosis Date   Anemia    Anxiety    BPH without obstruction/lower urinary tract symptoms    CAD (coronary artery disease)    Cervical disc disease    CKD (chronic kidney disease), stage III (HCC)    Gallstones    Hypertension    Hypothyroidism    Seizure disorder (HCC)    TIA (transient ischemic attack)    Vitamin B12 deficiency     Vitamin D deficiency     Past Surgical History:  Procedure Laterality Date   BLADDER SURGERY     CATARACT EXTRACTION     CHOLECYSTECTOMY     CYSTOSCOPY WITH INSERTION OF UROLIFT     HERNIA REPAIR     NECK SURGERY     ORIF of Left Arm     Skull Fracture needing surgery     TRANSURETHRAL RESECTION OF PROSTATE      Current Medications: Current Meds  Medication Sig   acetaminophen  (TYLENOL ) 500 MG tablet Take 500 mg by mouth every 6 (six) hours as needed.   atorvastatin (LIPITOR) 40 MG tablet Take 40 mg by mouth every other day.   Cholecalciferol (VITAMIN D-3) 125 MCG (5000 UT) TABS Take 1 tablet by mouth daily.   cyanocobalamin  (,VITAMIN B-12,) 1000 MCG/ML injection Inject 1,000 mcg into the skin every 30 (thirty) days.   diltiazem  (CARDIZEM  CD) 120 MG 24 hr capsule Take 1 capsule (120 mg total) by mouth daily.   levETIRAcetam  (KEPPRA ) 500 MG tablet Take 500 mg by mouth 2 (two) times daily.   levothyroxine (SYNTHROID) 88 MCG tablet Take 88 mcg by mouth every morning.   meclizine (ANTIVERT) 12.5 MG tablet Take 12.5 mg by mouth 3 (three) times daily as needed for dizziness.   methocarbamol (ROBAXIN) 500 MG tablet Take 500 mg by mouth.   Multiple Vitamins-Minerals (PRESERVISION AREDS 2 PO) Take  by mouth.   ondansetron  (ZOFRAN ) 4 MG tablet Take 4 mg by mouth every 8 (eight) hours as needed for nausea or vomiting.   Rimegepant Sulfate (NURTEC) 75 MG TBDP Take 1 tablet by mouth daily at 12 noon.   silodosin (RAPAFLO) 8 MG CAPS capsule Take 8 mg by mouth daily with breakfast.   tamsulosin (FLOMAX) 0.4 MG CAPS capsule Take 0.4 mg by mouth 2 (two) times daily.     Allergies:   Gabapentin, Codeine, Iron, Topiramate, and Valproic acid   Social History   Socioeconomic History   Marital status: Widowed    Spouse name: Not on file   Number of children: Not on file   Years of education: Not on file   Highest education level: Not on file  Occupational History   Not on file  Tobacco Use    Smoking status: Former   Smokeless tobacco: Former    Types: Designer, Multimedia Use   Vaping status: Never Used  Substance and Sexual Activity   Alcohol  use: Never   Drug use: Never   Sexual activity: Not on file  Other Topics Concern   Not on file  Social History Narrative   Not on file   Social Determinants of Health   Financial Resource Strain: Not on file  Food Insecurity: Low Risk  (09/28/2022)   Received from [facility]   Food vital sign    Within the past 12 months, you worried that your food would run out before you got money to buy more: Never true    Within the past 12 months, the food you bought just didn't last and you didn't have money to get more. : Never true  Transportation Needs: Not on file (09/28/2022)  Physical Activity: Not on file  Stress: Not on file  Social Connections: Not on file     Family History: The patient's family history includes Colon cancer in his sister; Diabetes in his mother; Hypertension in his son; Hypothyroidism in his son; Prostate cancer in his father.  ROS:   Please see the history of present illness.    All other systems reviewed and are negative.  EKGs/Labs/Other Studies Reviewed:    The following studies were reviewed today: Cardiac Studies & Procedures       ECHOCARDIOGRAM  ECHOCARDIOGRAM COMPLETE 10/02/2022  Narrative ECHOCARDIOGRAM REPORT    Patient Name:   Joshua Parker Date of Exam: 10/02/2022 Medical Rec #:  [id number]         Height:       68.0 in Accession #:    [id number]        Weight:       127.0 lb Date of Birth:  04-13-1940         BSA:          1.685 m Patient Age:    82 years          BP:           157/85 mmHg Patient Gender: M                 HR:           63 bpm. Exam Location:  [place]  Procedure: 2D Echo, Color Doppler, Cardiac Doppler and Strain Analysis  Indications:    Dyspnea on exertion [R06.09 (ICD-10-CM)]  History:        Patient has no prior history of Echocardiogram  examinations. CAD, TIA; Risk Factors:Hypertension.  Sonographer:    Lynwood Silvas  RDCS Referring Phys: [id number] Lorrin Nawrot C Krystale Rinkenberger  IMPRESSIONS   1. Left ventricular ejection fraction, by estimation, is 55 to 60%. The left ventricle has normal function. The left ventricle has no regional wall motion abnormalities. There is mild concentric left ventricular hypertrophy. Left ventricular diastolic parameters are consistent with Grade I diastolic dysfunction (impaired relaxation). The average left ventricular global longitudinal strain is -16.9 %. The global longitudinal strain is abnormal. 2. Right ventricular systolic function is normal. The right ventricular size is normal. There is normal pulmonary artery systolic pressure. 3. The mitral valve is degenerative. No evidence of mitral valve regurgitation. No evidence of mitral stenosis. 4. The aortic valve is tricuspid. Aortic valve regurgitation is mild. Aortic valve sclerosis is present, with no evidence of aortic valve stenosis. 5. Aortic Normal DTA. 6. The inferior vena cava is normal in size with greater than 50% respiratory variability, suggesting right atrial pressure of 3 mmHg.  FINDINGS Left Ventricle: Left ventricular ejection fraction, by estimation, is 55 to 60%. The left ventricle has normal function. The left ventricle has no regional wall motion abnormalities. The average left ventricular global longitudinal strain is -16.9 %. The global longitudinal strain is abnormal. The left ventricular internal cavity size was normal in size. There is mild concentric left ventricular hypertrophy. Left ventricular diastolic parameters are consistent with Grade I diastolic dysfunction (impaired relaxation). Normal left ventricular filling pressure.  Right Ventricle: The right ventricular size is normal. No increase in right ventricular wall thickness. Right ventricular systolic function is normal. There is normal pulmonary artery systolic pressure. The  tricuspid regurgitant velocity is 2.32 m/s, and with an assumed right atrial pressure of 3 mmHg, the estimated right ventricular systolic pressure is 24.5 mmHg.  Left [facility]: Left atrial size was normal in size.  Right [facility]: Right atrial size was normal in size.  Pericardium: There is no evidence of pericardial effusion.  Mitral Valve: The mitral valve is degenerative in appearance. Mild mitral annular calcification. No evidence of mitral valve regurgitation. No evidence of mitral valve stenosis.  Tricuspid Valve: The tricuspid valve is normal in structure. Tricuspid valve regurgitation is mild . No evidence of tricuspid stenosis.  Aortic Valve: The aortic valve is tricuspid. Aortic valve regurgitation is mild. Aortic regurgitation PHT measures 802 msec. Aortic valve sclerosis is present, with no evidence of aortic valve stenosis.  Pulmonic Valve: The pulmonic valve was normal in structure. Pulmonic valve regurgitation is mild. No evidence of pulmonic stenosis.  Aorta: The aortic root and ascending aorta are structurally normal, with no evidence of dilitation, the aortic arch was not well visualized and Normal DTA.  Venous: The inferior vena cava is normal in size with greater than 50% respiratory variability, suggesting right atrial pressure of 3 mmHg.  IAS/Shunts: No atrial level shunt detected by color flow Doppler.   LEFT VENTRICLE PLAX 2D LVIDd:         3.80 cm   Diastology LVIDs:         2.50 cm   LV e' medial:    6.09 cm/s LV PW:         1.10 cm   LV E/e' medial:  12.1 LV IVS:        1.10 cm   LV e' lateral:   6.64 cm/s LVOT diam:     2.00 cm   LV E/e' lateral: 11.1 LV SV:         80 LV SV Index:   47  2D Longitudinal Strain LVOT Area:     3.14 cm  2D Strain GLS Avg:     -16.9 %   RIGHT VENTRICLE             IVC RV Basal diam:  3.60 cm     IVC diam: 1.80 cm RV S prime:     10.10 cm/s TAPSE (M-mode): 2.1 cm  LEFT ATRIUM             Index        RIGHT ATRIUM            Index LA diam:        3.40 cm 2.02 cm/m   RA Area:     12.90 cm LA Vol (A2C):   37.8 ml 22.43 ml/m  RA Volume:   29.30 ml  17.39 ml/m LA Vol (A4C):   43.5 ml 25.82 ml/m LA Biplane Vol: 40.3 ml 23.92 ml/m AORTIC VALVE LVOT Vmax:   118.00 cm/s LVOT Vmean:  73.100 cm/s LVOT VTI:    0.254 m AI PHT:      802 msec  AORTA Ao Root diam: 3.60 cm Ao Asc diam:  2.90 cm Ao Desc diam: 2.20 cm  MV E velocity: 73.40 cm/s   TRICUSPID VALVE MV A velocity: 113.00 cm/s  TR Peak grad:   21.5 mmHg MV E/A ratio:  0.65         TR Vmax:        232.00 cm/s  SHUNTS Systemic VTI:  0.25 m Systemic Diam: 2.00 cm  Redell Leiter MD Electronically signed by Redell Leiter MD Signature Date/Time: 10/02/2022/12:19:49 PM    Final    MONITORS  LONG TERM MONITOR (3-14 DAYS) 09/13/2022  Narrative Patch Wear Time:  13 days and 23 hours (2024-06-14T10:50:32-0400 to 2024-06-28T10:50:24-0400)  Patient had a min HR of 43 bpm, max HR of 150 bpm, and avg HR of 63 bpm. Predominant underlying rhythm was Sinus Rhythm.  There were no symptomatic events.  There were no pauses of 3 seconds or greater and no episodes of second or third-degree AV nodal block.  10 Supraventricular Tachycardia runs occurred, the run with the fastest interval lasting 6 beats with a max rate of 150 bpm, the longest lasting 11 beats with an avg rate of 109 bpm.  There were no episodes of atrial fibrillation or flutter.  Isolated SVEs were frequent (6.5%, [id number]), SVE Couplets were rare (<1.0%, 5490), and SVE Triplets were rare (<1.0%, 1241).  Isolated VEs were rare (<1.0%), VE Couplets were rare (<1.0%), and no VE Triplets were present.            EKG:  EKG is  ordered today.  The ekg ordered today demonstrates sinus bradycardia, heart rate 55 bpm, consistent with prior EKG tracings.  Recent Labs: 08/18/2022: TSH 2.950 09/25/2022: ALT 35; BUN 31; Creatinine, Ser 1.72; Hemoglobin 10.0; Platelets 155; Potassium 4.4; Sodium 141   Recent Lipid Panel No results found for: CHOL, TRIG, HDL, CHOLHDL, VLDL, LDLCALC, LDLDIRECT   Risk Assessment/Calculations:      HYPERTENSION CONTROL Vitals:   10/19/22 1106 10/19/22 1135  BP: (!) 140/70 (!) 140/70    The patient's blood pressure is elevated above target today.  In order to address the patient's elevated BP: Blood pressure will be monitored at home to determine if medication changes need to be made.            Physical Exam:    VS:  BP (!) 140/70   Pulse ROLLEN)  54   Ht 5' 8 (1.727 m)   Wt 134 lb (60.8 kg)   SpO2 99%   BMI 20.37 kg/m     Wt Readings from Last 3 Encounters:  10/19/22 134 lb (60.8 kg)  09/25/22 127 lb (57.6 kg)  08/18/22 131 lb 14.4 oz (59.8 kg)     GEN: Frail, thin, well developed in no acute distress HEENT: Normal NECK: No JVD; No carotid bruits LYMPHATICS: No lymphadenopathy CARDIAC: RRR, no murmurs, rubs, gallops RESPIRATORY:  Clear to auscultation without rales, wheezing or rhonchi  ABDOMEN: Soft, non-tender, non-distended MUSCULOSKELETAL:  No edema; No deformity  SKIN: Warm and dry NEUROLOGIC:  Alert and oriented x 3 PSYCHIATRIC:  Normal affect   ASSESSMENT:    1. Other fatigue   2. Bradycardia   3. Essential hypertension   4. Dyspnea on exertion     PLAN:    In order of problems listed above:  Bradycardia-asymptomatic.  Avoid nodal blocking agents. Hypertension-blood pressure in office is mildly elevated at 140/70 today.  His olmesartan was recently stopped secondary to kidney dysfunction, do not think we should restart his medication at this time and allow for permissive hypertension as he is feeling significantly better since this was stopped and he was started on Cardizem . CKD stage IIIa/anemia of chronic disease-being followed by hematology, evaluated by nephrology as well they advised he could follow-up on a as needed basis as his CKD was stable. Fatigue-we previously check lab work including TSH,  all stable.  Multifactorial related to CKD, chronic anemia.  We repeated a monitor which revealed episodes of SVT and started him on Cardizem , he states he feels significantly better since this was started.  Currently, he complains of no fatigue.  Disposition-follow-up in 3 months with Dr. Monetta.            Medication Adjustments/Labs and Tests Ordered: Current medicines are reviewed at length with the patient today.  Concerns regarding medicines are outlined above.  No orders of the defined types were placed in this encounter.  No orders of the defined types were placed in this encounter.   Patient Instructions  Medication Instructions:  Your physician recommends that you continue on your current medications as directed. Please refer to the Current Medication list given to you today.  *If you need a refill on your cardiac medications before your next appointment, please call your pharmacy*   Lab Work: None ordered If you have labs (blood work) drawn today and your tests are completely normal, you will receive your results only by: MyChart Message (if you have MyChart) OR A paper copy in the mail If you have any lab test that is abnormal or we need to change your treatment, we will call you to review the results.   Testing/Procedures: None ordered   Follow-Up: At [facility], you and your health needs are our priority.  As part of our continuing mission to provide you with exceptional heart care, we have created designated Provider Care Teams.  These Care Teams include your primary Cardiologist (physician) and Advanced Practice Providers (APPs -  Physician Assistants and Nurse Practitioners) who all work together to provide you with the care you need, when you need it.  We recommend signing up for the patient portal called MyChart.  Sign up information is provided on this After Visit Summary.  MyChart is used to connect with patients for Virtual Visits  (Telemedicine).  Patients are able to view lab/test results, encounter notes, upcoming appointments, etc.  Non-urgent messages can be sent to your provider as well.   To learn more about what you can do with MyChart, go to [email or web address].    Your next appointment:   3 month(s)  The format for your next appointment:   In Person  Provider:   Redell Leiter, MD    Other Instructions none  Important Information About Sugar        Signed, Delon JAYSON Hoover, NP  10/19/2022 11:36 AM    [facility] "

## 2022-10-19 ENCOUNTER — Ambulatory Visit: Payer: Medicare Other | Attending: Cardiology | Admitting: Cardiology

## 2022-10-19 ENCOUNTER — Encounter: Payer: Self-pay | Admitting: Cardiology

## 2022-10-19 VITALS — BP 140/70 | HR 54 | Ht 68.0 in | Wt 134.0 lb

## 2022-10-19 DIAGNOSIS — R5383 Other fatigue: Secondary | ICD-10-CM

## 2022-10-19 DIAGNOSIS — N183 Chronic kidney disease, stage 3 unspecified: Secondary | ICD-10-CM | POA: Diagnosis present

## 2022-10-19 DIAGNOSIS — R0609 Other forms of dyspnea: Secondary | ICD-10-CM | POA: Diagnosis present

## 2022-10-19 DIAGNOSIS — R001 Bradycardia, unspecified: Secondary | ICD-10-CM | POA: Diagnosis present

## 2022-10-19 DIAGNOSIS — I10 Essential (primary) hypertension: Secondary | ICD-10-CM | POA: Diagnosis present

## 2022-10-19 NOTE — Patient Instructions (Signed)
Medication Instructions:  Your physician recommends that you continue on your current medications as directed. Please refer to the Current Medication list given to you today.  *If you need a refill on your cardiac medications before your next appointment, please call your pharmacy*   Lab Work: None ordered If you have labs (blood work) drawn today and your tests are completely normal, you will receive your results only by: MyChart Message (if you have MyChart) OR A paper copy in the mail If you have any lab test that is abnormal or we need to change your treatment, we will call you to review the results.   Testing/Procedures: None ordered   Follow-Up: At [facility], you and your health needs are our priority.  As part of our continuing mission to provide you with exceptional heart care, we have created designated Provider Care Teams.  These Care Teams include your primary Cardiologist (physician) and Advanced Practice Providers (APPs -  Physician Assistants and Nurse Practitioners) who all work together to provide you with the care you need, when you need it.  We recommend signing up for the patient portal called "MyChart".  Sign up information is provided on this After Visit Summary.  MyChart is used to connect with patients for Virtual Visits (Telemedicine).  Patients are able to view lab/test results, encounter notes, upcoming appointments, etc.  Non-urgent messages can be sent to your provider as well.   To learn more about what you can do with MyChart, go to [email or web address].    Your next appointment:   3 month(s)  The format for your next appointment:   In Person  Provider:   Brian Munley, MD    Other Instructions none  Important Information About Sugar      

## 2022-10-23 ENCOUNTER — Inpatient Hospital Stay: Payer: Medicare Other | Attending: Oncology

## 2022-10-23 DIAGNOSIS — N1832 Chronic kidney disease, stage 3b: Secondary | ICD-10-CM | POA: Diagnosis not present

## 2022-10-23 DIAGNOSIS — D539 Nutritional anemia, unspecified: Secondary | ICD-10-CM

## 2022-10-23 DIAGNOSIS — D631 Anemia in chronic kidney disease: Secondary | ICD-10-CM | POA: Diagnosis not present

## 2022-10-23 LAB — CBC WITH DIFFERENTIAL/PLATELET
Abs Immature Granulocytes: 0.01 10*3/uL (ref 0.00–0.07)
Basophils Absolute: 0 10*3/uL (ref 0.0–0.1)
Basophils Relative: 1 %
Eosinophils Absolute: 0.2 10*3/uL (ref 0.0–0.5)
Eosinophils Relative: 3 %
HCT: 30.5 % — ABNORMAL LOW (ref 39.0–52.0)
Hemoglobin: 9.9 g/dL — ABNORMAL LOW (ref 13.0–17.0)
Immature Granulocytes: 0 %
Lymphocytes Relative: 37 %
Lymphs Abs: 2.1 10*3/uL (ref 0.7–4.0)
MCH: 31.1 pg (ref 26.0–34.0)
MCHC: 32.5 g/dL (ref 30.0–36.0)
MCV: 95.9 fL (ref 80.0–100.0)
Monocytes Absolute: 0.5 10*3/uL (ref 0.1–1.0)
Monocytes Relative: 9 %
Neutro Abs: 2.9 10*3/uL (ref 1.7–7.7)
Neutrophils Relative %: 50 %
Platelets: 149 10*3/uL — ABNORMAL LOW (ref 150–400)
RBC: 3.18 MIL/uL — ABNORMAL LOW (ref 4.22–5.81)
RDW: 12.7 % (ref 11.5–15.5)
WBC: 5.7 10*3/uL (ref 4.0–10.5)
nRBC: 0 % (ref 0.0–0.2)

## 2022-10-23 LAB — BASIC METABOLIC PANEL
Anion gap: 13 (ref 5–15)
BUN: 39 mg/dL — ABNORMAL HIGH (ref 8–23)
CO2: 24 mmol/L (ref 22–32)
Calcium: 9.8 mg/dL (ref 8.9–10.3)
Chloride: 106 mmol/L (ref 98–111)
Creatinine, Ser: 1.81 mg/dL — ABNORMAL HIGH (ref 0.61–1.24)
GFR, Estimated: 37 mL/min — ABNORMAL LOW (ref >60)
Glucose, Bld: 90 mg/dL (ref 70–99)
Potassium: 4.6 mmol/L (ref 3.5–5.1)
Sodium: 143 mmol/L (ref 135–145)

## 2022-10-23 LAB — FERRITIN: Ferritin: 157 ng/mL (ref 24–336)

## 2022-10-24 ENCOUNTER — Other Ambulatory Visit: Payer: Self-pay | Admitting: Pharmacist

## 2022-10-25 ENCOUNTER — Inpatient Hospital Stay: Payer: Medicare Other

## 2022-10-25 VITALS — BP 149/71 | HR 101 | Temp 97.9°F | Resp 18 | Wt 132.1 lb

## 2022-10-25 DIAGNOSIS — N1832 Chronic kidney disease, stage 3b: Secondary | ICD-10-CM | POA: Diagnosis not present

## 2022-10-25 DIAGNOSIS — D631 Anemia in chronic kidney disease: Secondary | ICD-10-CM | POA: Diagnosis not present

## 2022-10-25 DIAGNOSIS — N183 Chronic kidney disease, stage 3 unspecified: Secondary | ICD-10-CM

## 2022-10-25 MED ORDER — EPOETIN ALFA-EPBX 20000 UNIT/ML IJ SOLN
20000.0000 [IU] | Freq: Once | INTRAMUSCULAR | Status: AC
  Administered 2022-10-25: 20000 [IU] via SUBCUTANEOUS
  Filled 2022-10-25: qty 1

## 2022-10-25 NOTE — Patient Instructions (Signed)

## 2022-10-26 DIAGNOSIS — I10 Essential (primary) hypertension: Secondary | ICD-10-CM | POA: Diagnosis not present

## 2022-10-26 DIAGNOSIS — Z6821 Body mass index (BMI) 21.0-21.9, adult: Secondary | ICD-10-CM | POA: Diagnosis not present

## 2022-10-26 DIAGNOSIS — Z79899 Other long term (current) drug therapy: Secondary | ICD-10-CM | POA: Diagnosis not present

## 2022-10-26 DIAGNOSIS — E559 Vitamin D deficiency, unspecified: Secondary | ICD-10-CM | POA: Diagnosis not present

## 2022-10-26 DIAGNOSIS — Z0000 Encounter for general adult medical examination without abnormal findings: Secondary | ICD-10-CM | POA: Diagnosis not present

## 2022-10-26 DIAGNOSIS — Z7189 Other specified counseling: Secondary | ICD-10-CM | POA: Diagnosis not present

## 2022-10-26 DIAGNOSIS — E785 Hyperlipidemia, unspecified: Secondary | ICD-10-CM | POA: Diagnosis not present

## 2022-10-26 DIAGNOSIS — E039 Hypothyroidism, unspecified: Secondary | ICD-10-CM | POA: Diagnosis not present

## 2022-10-26 DIAGNOSIS — D509 Iron deficiency anemia, unspecified: Secondary | ICD-10-CM | POA: Diagnosis not present

## 2022-10-26 DIAGNOSIS — M542 Cervicalgia: Secondary | ICD-10-CM | POA: Diagnosis not present

## 2022-10-26 DIAGNOSIS — Z1331 Encounter for screening for depression: Secondary | ICD-10-CM | POA: Diagnosis not present

## 2022-11-15 DIAGNOSIS — D649 Anemia, unspecified: Secondary | ICD-10-CM | POA: Diagnosis not present

## 2022-11-16 DIAGNOSIS — R32 Unspecified urinary incontinence: Secondary | ICD-10-CM | POA: Diagnosis not present

## 2022-11-20 ENCOUNTER — Telehealth: Payer: Self-pay

## 2022-11-20 ENCOUNTER — Inpatient Hospital Stay: Payer: Medicare Other | Attending: Oncology

## 2022-11-20 DIAGNOSIS — D539 Nutritional anemia, unspecified: Secondary | ICD-10-CM

## 2022-11-20 DIAGNOSIS — Z79899 Other long term (current) drug therapy: Secondary | ICD-10-CM | POA: Insufficient documentation

## 2022-11-20 DIAGNOSIS — N189 Chronic kidney disease, unspecified: Secondary | ICD-10-CM | POA: Insufficient documentation

## 2022-11-20 DIAGNOSIS — D631 Anemia in chronic kidney disease: Secondary | ICD-10-CM | POA: Insufficient documentation

## 2022-11-20 LAB — CBC WITH DIFFERENTIAL/PLATELET
Abs Immature Granulocytes: 0.01 10*3/uL (ref 0.00–0.07)
Basophils Absolute: 0 10*3/uL (ref 0.0–0.1)
Basophils Relative: 0 %
Eosinophils Absolute: 0.1 10*3/uL (ref 0.0–0.5)
Eosinophils Relative: 1 %
HCT: 34.4 % — ABNORMAL LOW (ref 39.0–52.0)
Hemoglobin: 11 g/dL — ABNORMAL LOW (ref 13.0–17.0)
Immature Granulocytes: 0 %
Lymphocytes Relative: 31 %
Lymphs Abs: 1.9 10*3/uL (ref 0.7–4.0)
MCH: 31 pg (ref 26.0–34.0)
MCHC: 32 g/dL (ref 30.0–36.0)
MCV: 96.9 fL (ref 80.0–100.0)
Monocytes Absolute: 0.5 10*3/uL (ref 0.1–1.0)
Monocytes Relative: 8 %
Neutro Abs: 3.6 10*3/uL (ref 1.7–7.7)
Neutrophils Relative %: 60 %
Platelets: 145 10*3/uL — ABNORMAL LOW (ref 150–400)
RBC: 3.55 MIL/uL — ABNORMAL LOW (ref 4.22–5.81)
RDW: 13.2 % (ref 11.5–15.5)
WBC: 6 10*3/uL (ref 4.0–10.5)
nRBC: 0 % (ref 0.0–0.2)

## 2022-11-20 LAB — BASIC METABOLIC PANEL
Anion gap: 7 (ref 5–15)
BUN: 39 mg/dL — ABNORMAL HIGH (ref 8–23)
CO2: 25 mmol/L (ref 22–32)
Calcium: 8.8 mg/dL — ABNORMAL LOW (ref 8.9–10.3)
Chloride: 108 mmol/L (ref 98–111)
Creatinine, Ser: 1.75 mg/dL — ABNORMAL HIGH (ref 0.61–1.24)
GFR, Estimated: 38 mL/min — ABNORMAL LOW (ref >60)
Glucose, Bld: 92 mg/dL (ref 70–99)
Potassium: 4.3 mmol/L (ref 3.5–5.1)
Sodium: 140 mmol/L (ref 135–145)

## 2022-11-20 LAB — FERRITIN: Ferritin: 159 ng/mL (ref 24–336)

## 2022-11-20 NOTE — Telephone Encounter (Signed)
Pt here today for lab draw w/son. The pt and son report that the injections aren't helping at all. "If I didn't feel the shot when they give it, I wouldn't know that I even got one" per pt. "I just get tired walking from house to the car". Pt's son asked, "is there anything else we could do like give him blood to make him stronger"?

## 2022-11-21 ENCOUNTER — Telehealth: Payer: Self-pay | Admitting: Oncology

## 2022-11-21 ENCOUNTER — Encounter: Payer: Self-pay | Admitting: Oncology

## 2022-11-21 ENCOUNTER — Inpatient Hospital Stay: Payer: Medicare Other | Admitting: Oncology

## 2022-11-21 NOTE — Telephone Encounter (Signed)
Attempted to call patient and let him know that he does not need his shot due to his HGB being higher than the limit. No answer and no identifiable VM.

## 2022-11-21 NOTE — Telephone Encounter (Signed)
11/21/2022  Patient missed his Afternoon Appt - Unable to LM on pt's phone - Left Msg on son's phone to return call to reschedule Appt

## 2022-11-21 NOTE — Telephone Encounter (Signed)
11/21/22 LVM-appt today at 330pm.

## 2022-11-22 ENCOUNTER — Inpatient Hospital Stay: Payer: Medicare Other

## 2022-11-22 NOTE — Progress Notes (Signed)
[facility] Cancer Follow up Visit:  Patient Care Team: [name], MD as PCP - General (Internal Medicine) Dulce Sellar Iline Oven, MD as PCP - Cardiology (Cardiology)  CHIEF COMPLAINTS/PURPOSE OF CONSULTATION:  HISTORY OF PRESENTING ILLNESS:  Joshua Parker 82 y.o. male is here because of anemia.  Medical history notable for BPH, coronary artery disease, cervical disc disease, CKD, gallstones, hypertension, hypothyroidism, TIA, vitamin D deficiency, right inguinal hernia, vitamin B12 deficiency  March 09, 2022 WBC 7.5 hemoglobin 9.3 MCV 94 platelet count 227 Ferritin 421 creatinine 1.58 (Cr calculated 30)  April 04, 2022: [facility] Hematology Consult  Can not tolerate oral iron therefore received IV iron about 3 months ago.  Has not required PRBC's in the past.  Developed severe constipation from oral iron.      No reaction to IV iron.  Has a regular diet.  No history of hemorrhage postoperatively requiring transfusion.  No hematochezia, melena, hemoptysis, hematuria.   No history of intra-articular or soft tissue bleeding.  Bruises easily.  Uses Tylenol for pain.  Does not use NSAIDS for painI.  Is not taking oral anticoagulants or antiplatelet drugs.  No history of abnormal bleeding in family members Patient has symptoms of fatigue, pallor,  DOE, decreased performance status.  No history of colonoscopy, or egd and wishes to avoid instrumentation  Social:  Did furniture work and Holiday representative.  Tobacco smoked 40 yrs; quit 20 yrs ago.  EtOH none  WBC 7.3 hemoglobin 9.6 MCV 97 platelet count 143; 67 segs 23 lymphs 9 monos 1 EO leukocyte count 1.1% Coombs test negative haptoglobin 161 INR 1.1 PTT 33 SPEP with IEP showed no monoclonal protein.  Serum free kappa 55.4 lambda 42.6 with a kappa lambda 1.30 IgG 1159 IgA 287 IgM 195 PSA 2.26.  H. pylori stool antigen negative Ferritin 182 folate 15.6 B12 487 Copper 100 zinc 68 CMP notable for creatinine 1.69 BUN  44 Testosterone 143  April 12, 2022: Erythropoietin 20,000 units  April 18, 2022:   Reviewed results of labs with patient and son.  Feels better since receiving Epo.  Discussed how Epo works to help with anemia April 26, 2022: EPO 20,000 units  May 16 2022:    Feels well.  Appetite good.  Weight stable.   Energy good.  Son notices that patient is more active  Ferritin 194.  Hemoglobin 11.2  July 03 2022:  Hgb 10.4 Cr 1.55  Jul 17 2022:  Marland Kitchen More fatigued.  Patient and son are worried that he may require HD.  Discussed indications for HD which he does not currently meet. Patient saw his father go through HD.  Explained that Epo effect is transient and needs to be re-dosed.    Hemoglobin 9.9 creatinine 1.54  Jul 19 2022: Erythropoietin 20,000 units  August 28, 2022: Hemoglobin 10.0 creatinine 1.7  September 25 2022:    Has some pretty good days and some pretty bad days.    Taking a MVI with iron and eating meat in the hope that it improves his energy.  Bad days are usually related to HA's and arthritis.  Has started Cardizem this week per cardiology to manage arrhythmias.    September 28 2022:  Nephrology Consult--Etiology is likely multifactorial in the setting of long-term NSAID use, along with HTN/microvascular disease, BPH/LUTS. Previous paraproteinemia workup by hematology was negative: No monoclonal protein. Serum free kappa 55.4 lambda 42.6 with a kappa lambda 1.30. Olmesartan 40mg  daily discontinued ~2 weeks ago by cardiology due  to hypotension, and switching patient to Cardizem for SVT. -Most recent labs on 09/25/2022 with creatinine 1.7. Electrolytes stable. Will get full CKD serum labs at next appointment. -Will obtain UA, UPC, urine albumin. -Will obtain renal ultrasound. -Instructed to stop NSAID use  October 02 2022: Cardiac echo showed LVEF 55 to 60% with normal wall motion.  Grade 1 diastolic dysfunction.  RV systolic function normal.  No valvular abnormalities  November 23 2022:  Scheduled follow-up for management of anemia.  Patient has been bedridden for the past 3 days.  Has felt weak, not eating well. Began shaking this morning.  Denies chills, dysuria, cough.  No new pain.  Has been too weak to walk into the office today and was brought in by wheel chair.     WBC 6.0 hemoglobin 11.0 platelet count 145; differential.  normal ferritin 159 BMP notable for creatinine 1.75  Review of Systems - Oncology  MEDICAL HISTORY: Past Medical History:  Diagnosis Date   Anemia    Anxiety    BPH without obstruction/lower urinary tract symptoms    CAD (coronary artery disease)    Cervical disc disease    CKD (chronic kidney disease), stage III (HCC)    Gallstones    Hypertension    Hypothyroidism    Seizure disorder (HCC)    TIA (transient ischemic attack)    Vitamin B12 deficiency    Vitamin D deficiency     SURGICAL HISTORY: Past Surgical History:  Procedure Laterality Date   BLADDER SURGERY     CATARACT EXTRACTION     CHOLECYSTECTOMY     CYSTOSCOPY WITH INSERTION OF UROLIFT     HERNIA REPAIR     NECK SURGERY     ORIF of Left Arm     Skull Fracture needing surgery     TRANSURETHRAL RESECTION OF PROSTATE      SOCIAL HISTORY: Social History   Socioeconomic History   Marital status: Widowed    Spouse name: Not on file   Number of children: Not on file   Years of education: Not on file   Highest education level: Not on file  Occupational History   Not on file  Tobacco Use   Smoking status: Former   Smokeless tobacco: Former    Types: Designer, multimedia Use   Vaping status: Never Used  Substance and Sexual Activity   Alcohol use: Never   Drug use: Never   Sexual activity: Not on file  Other Topics Concern   Not on file  Social History Narrative   Not on file   Social Determinants of Health   Financial Resource Strain: Not on file  Food Insecurity: Low Risk  (09/28/2022)   Received from [facility]   Hunger Vital Sign    Worried About  Running Out of Food in the Last Year: Never true    Ran Out of Food in the Last Year: Never true  Transportation Needs: Not on file (09/28/2022)  Physical Activity: Not on file  Stress: Not on file  Social Connections: Not on file  Intimate Partner Violence: Not At Risk (04/04/2022)   Humiliation, Afraid, Rape, and Kick questionnaire    Fear of Current or Ex-Partner: No    Emotionally Abused: No    Physically Abused: No    Sexually Abused: No    FAMILY HISTORY Family History  Problem Relation Age of Onset   Diabetes Mother    Prostate cancer Father    Colon cancer Sister  Hypertension Son    Hypothyroidism Son     ALLERGIES:  is allergic to gabapentin, codeine, iron, topiramate, and valproic acid.  MEDICATIONS:  Current Outpatient Medications  Medication Sig Dispense Refill   acetaminophen (TYLENOL) 500 MG tablet Take 500 mg by mouth every 6 (six) hours as needed.     atorvastatin (LIPITOR) 40 MG tablet Take 40 mg by mouth every other day.     Cholecalciferol (VITAMIN D-3) 125 MCG (5000 UT) TABS Take 1 tablet by mouth daily.     cyanocobalamin (,VITAMIN B-12,) 1000 MCG/ML injection Inject 1,000 mcg into the skin every 30 (thirty) days.     diltiazem (CARDIZEM CD) 120 MG 24 hr capsule Take 1 capsule (120 mg total) by mouth daily. 90 capsule 3   levETIRAcetam (KEPPRA) 500 MG tablet Take 500 mg by mouth 2 (two) times daily.     levothyroxine (SYNTHROID) 88 MCG tablet Take 88 mcg by mouth every morning.     meclizine (ANTIVERT) 12.5 MG tablet Take 12.5 mg by mouth 3 (three) times daily as needed for dizziness.     methocarbamol (ROBAXIN) 500 MG tablet Take 500 mg by mouth.     Multiple Vitamins-Minerals (PRESERVISION AREDS 2 PO) Take by mouth.     ondansetron (ZOFRAN) 4 MG tablet Take 4 mg by mouth every 8 (eight) hours as needed for nausea or vomiting.     Rimegepant Sulfate (NURTEC) 75 MG TBDP Take 1 tablet by mouth daily at 12 noon.     silodosin (RAPAFLO) 8 MG CAPS capsule  Take 8 mg by mouth daily with breakfast.     tamsulosin (FLOMAX) 0.4 MG CAPS capsule Take 0.4 mg by mouth 2 (two) times daily.     No current facility-administered medications for this visit.    PHYSICAL EXAMINATION:  ECOG PERFORMANCE STATUS: 2 - Symptomatic, <50% confined to bed   There were no vitals filed for this visit.    There were no vitals filed for this visit.     Physical Exam Vitals and nursing note reviewed.  Constitutional:      Appearance: Normal appearance. He is not toxic-appearing or diaphoretic.     Comments: Thin.  Chronically ill appearing.  Temporal wasting  HENT:     Head: Normocephalic and atraumatic.     Right Ear: External ear normal.     Left Ear: External ear normal.     Nose: Nose normal.  Eyes:     General: No scleral icterus.    Conjunctiva/sclera: Conjunctivae normal.     Pupils: Pupils are equal, round, and reactive to light.  Cardiovascular:     Rate and Rhythm: Regular rhythm. Tachycardia present.     Pulses: Normal pulses.     Heart sounds: Normal heart sounds.     No friction rub. No gallop.  Pulmonary:     Effort: Pulmonary effort is normal. No respiratory distress.     Breath sounds: Normal breath sounds. No stridor. No wheezing or rales.  Abdominal:     General: Abdomen is flat.     Tenderness: There is no abdominal tenderness. There is no guarding or rebound.  Musculoskeletal:        General: No swelling.     Cervical back: Normal range of motion and neck supple. No rigidity or tenderness.     Right lower leg: No edema.     Left lower leg: No edema.     Comments: Decreased muscle mass.  Kyphotic  Lymphadenopathy:  Head:     Right side of head: No submental, submandibular, tonsillar, preauricular, posterior auricular or occipital adenopathy.     Left side of head: No submental, submandibular, tonsillar, preauricular, posterior auricular or occipital adenopathy.     Cervical: No cervical adenopathy.     Right cervical:  No superficial, deep or posterior cervical adenopathy.    Left cervical: No superficial, deep or posterior cervical adenopathy.     Upper Body:     Right upper body: No supraclavicular or axillary adenopathy.     Left upper body: No supraclavicular or axillary adenopathy.     Lower Body: No right inguinal adenopathy. No left inguinal adenopathy.  Skin:    Coloration: Skin is not jaundiced or pale.     Findings: Bruising present.  Neurological:     General: No focal deficit present.     Mental Status: He is alert and oriented to person, place, and time.     Comments: Decreased hearing  Psychiatric:        Mood and Affect: Mood normal.        Behavior: Behavior normal.        Thought Content: Thought content normal.        Judgment: Judgment normal.     LABORATORY DATA: I have personally reviewed the data as listed:  Appointment on 11/20/2022  Component Date Value Ref Range Status   Ferritin 11/20/2022 159  24 - 336 ng/mL Final   Performed at [facility], [address], [place], [place] [address]   Sodium 11/20/2022 140  135 - 145 mmol/L Final   Potassium 11/20/2022 4.3  3.5 - 5.1 mmol/L Final   Chloride 11/20/2022 108  98 - 111 mmol/L Final   CO2 11/20/2022 25  22 - 32 mmol/L Final   Glucose, Bld 11/20/2022 92  70 - 99 mg/dL Final   Glucose reference range applies only to samples taken after fasting for at least 8 hours.   BUN 11/20/2022 39 (H)  8 - 23 mg/dL Final   Creatinine, Ser 11/20/2022 1.75 (H)  0.61 - 1.24 mg/dL Final   Calcium 13/10/6576 8.8 (L)  8.9 - 10.3 mg/dL Final   GFR, Estimated 11/20/2022 38 (L)  >60 mL/min Final   Comment: (NOTE) Calculated using the CKD-EPI Creatinine Equation (2021)    Anion gap 11/20/2022 7  5 - 15 Final   Performed at [facility], [address], [place], [place] [address]   WBC 11/20/2022 6.0  4.0 - 10.5 K/uL Final   RBC 11/20/2022 3.55 (L)  4.22 - 5.81 MIL/uL Final   Hemoglobin 11/20/2022  11.0 (L)  13.0 - 17.0 g/dL Final   HCT 95/28/4132 34.4 (L)  39.0 - 52.0 % Final   MCV 11/20/2022 96.9  80.0 - 100.0 fL Final   MCH 11/20/2022 31.0  26.0 - 34.0 pg Final   MCHC 11/20/2022 32.0  30.0 - 36.0 g/dL Final   RDW 44/03/270 13.2  11.5 - 15.5 % Final   Platelets 11/20/2022 145 (L)  150 - 400 K/uL Final   nRBC 11/20/2022 0.0  0.0 - 0.2 % Final   Neutrophils Relative % 11/20/2022 60  % Final   Neutro Abs 11/20/2022 3.6  1.7 - 7.7 K/uL Final   Lymphocytes Relative 11/20/2022 31  % Final   Lymphs Abs 11/20/2022 1.9  0.7 - 4.0 K/uL Final   Monocytes Relative 11/20/2022 8  % Final   Monocytes Absolute 11/20/2022 0.5  0.1 -  1.0 K/uL Final   Eosinophils Relative 11/20/2022 1  % Final   Eosinophils Absolute 11/20/2022 0.1  0.0 - 0.5 K/uL Final   Basophils Relative 11/20/2022 0  % Final   Basophils Absolute 11/20/2022 0.0  0.0 - 0.1 K/uL Final   Immature Granulocytes 11/20/2022 0  % Final   Abs Immature Granulocytes 11/20/2022 0.01  0.00 - 0.07 K/uL Final   Performed at [facility], [address], [place], [place] [address]    RADIOGRAPHIC STUDIES: I have personally reviewed the radiological images as listed and agree with the findings in the report  No results found.  ASSESSMENT/PLAN  Anemia:  Multifactorial.  Main contributions are CKD (Cr Clearance 30) and low testosterone level April 12 2021:  Epo 20 000 units.   April 18 2021:  Following Hgb and ferritin levels.  May ultimately require IV iron as stores mobilized to make Hgb.  Holding on testosterone replacement due to thrombotic risk May 16 2022:  Hgb improved and ferritin adequate.  Will continue Epo.   Jul 17 2022- Hgb has trended downward due to recent ruling by CMS which precludes use unless Hgb < 10.0.  Will continue to follow Hgb Jul 19 2022- Erythropoietin 20,000 units  September 20 2022- Check CBC with diff monthly.  Only requiring Epo intermittently October 25 2022- Epo 20 000 November 20 2022- Hgb 11.0.  Cr 1.75.  Ferritin 159 November 23 2022:  Hgb is improved with use of ESA.  His degree of anemia does not account for fatigue.    Hypothyroidism:  On replacement.  Can contribute to anemia  Bruising:  Can be due to skin fragility.   April 04 2022:   PLT count, PT/PTT are normal  Frail elderly:  Limits tolerance to instrumentation  September 20 2022- Discussed various causes of fatigue with patient and son- anemia, cardiac, CKD,  chronic pain.    Chronic kidney disease  Jul 17 2022- Discussed possible causes with patient and son, at their request.  Assured them the he does not meet criteria for requiring HD at this time  September 28 2022- Nephrology Consult.   Etiology is likely multifactorial in the setting of long-term NSAID use, along with HTN/microvascular disease, BPH/LUTS      Cancer Staging  No matching staging information was found for the patient.    No problem-specific Assessment & Plan notes found for this encounter.   No orders of the defined types were placed in this encounter.  30  minutes was spent in patient care.  This included time spent preparing to see the patient (e.g., review of tests), obtaining and/or reviewing separately obtained history, counseling and educating the patient/family/caregiver, ordering medications, tests, or procedures; documenting clinical information in the electronic or other health record, independently interpreting results and communicating results to the patient/family/caregiver as well as coordination of care.       All questions were answered. The patient knows to call the clinic with any problems, questions or concerns.  This note was electronically signed.    Loni Muse, MD  11/22/2022 9:16 AM

## 2022-11-23 ENCOUNTER — Encounter: Payer: Self-pay | Admitting: Oncology

## 2022-11-23 ENCOUNTER — Inpatient Hospital Stay (INDEPENDENT_AMBULATORY_CARE_PROVIDER_SITE_OTHER): Payer: Medicare Other | Admitting: Oncology

## 2022-11-23 VITALS — BP 148/88 | HR 68 | Temp 97.9°F | Resp 18 | Ht 68.0 in | Wt 130.0 lb

## 2022-11-23 DIAGNOSIS — E861 Hypovolemia: Secondary | ICD-10-CM | POA: Diagnosis not present

## 2022-11-23 DIAGNOSIS — E538 Deficiency of other specified B group vitamins: Secondary | ICD-10-CM | POA: Diagnosis not present

## 2022-11-23 DIAGNOSIS — R339 Retention of urine, unspecified: Secondary | ICD-10-CM | POA: Diagnosis not present

## 2022-11-23 DIAGNOSIS — D539 Nutritional anemia, unspecified: Secondary | ICD-10-CM | POA: Diagnosis not present

## 2022-11-23 DIAGNOSIS — R54 Age-related physical debility: Secondary | ICD-10-CM

## 2022-11-23 DIAGNOSIS — R319 Hematuria, unspecified: Secondary | ICD-10-CM | POA: Diagnosis not present

## 2022-11-23 DIAGNOSIS — K4090 Unilateral inguinal hernia, without obstruction or gangrene, not specified as recurrent: Secondary | ICD-10-CM | POA: Diagnosis not present

## 2022-11-23 DIAGNOSIS — I2510 Atherosclerotic heart disease of native coronary artery without angina pectoris: Secondary | ICD-10-CM | POA: Diagnosis not present

## 2022-11-23 DIAGNOSIS — Z79899 Other long term (current) drug therapy: Secondary | ICD-10-CM | POA: Diagnosis not present

## 2022-11-23 DIAGNOSIS — Z1152 Encounter for screening for COVID-19: Secondary | ICD-10-CM | POA: Diagnosis not present

## 2022-11-23 DIAGNOSIS — N1832 Chronic kidney disease, stage 3b: Secondary | ICD-10-CM | POA: Diagnosis not present

## 2022-11-23 DIAGNOSIS — N400 Enlarged prostate without lower urinary tract symptoms: Secondary | ICD-10-CM | POA: Diagnosis not present

## 2022-11-23 DIAGNOSIS — I77819 Aortic ectasia, unspecified site: Secondary | ICD-10-CM | POA: Diagnosis not present

## 2022-11-23 DIAGNOSIS — I771 Stricture of artery: Secondary | ICD-10-CM | POA: Diagnosis not present

## 2022-11-23 DIAGNOSIS — R531 Weakness: Secondary | ICD-10-CM | POA: Diagnosis not present

## 2022-11-23 NOTE — Progress Notes (Signed)
Patient transported to ED via wheelchair per orders of Dr. Angelene Giovanni. Report given to Gene, LPN.

## 2022-11-27 DIAGNOSIS — R32 Unspecified urinary incontinence: Secondary | ICD-10-CM | POA: Diagnosis not present

## 2022-11-27 DIAGNOSIS — R339 Retention of urine, unspecified: Secondary | ICD-10-CM | POA: Diagnosis not present

## 2022-11-27 DIAGNOSIS — R399 Unspecified symptoms and signs involving the genitourinary system: Secondary | ICD-10-CM | POA: Diagnosis not present

## 2022-11-27 DIAGNOSIS — R3915 Urgency of urination: Secondary | ICD-10-CM | POA: Diagnosis not present

## 2022-11-27 DIAGNOSIS — N390 Urinary tract infection, site not specified: Secondary | ICD-10-CM | POA: Diagnosis not present

## 2022-11-28 DIAGNOSIS — R399 Unspecified symptoms and signs involving the genitourinary system: Secondary | ICD-10-CM | POA: Diagnosis not present

## 2022-11-28 DIAGNOSIS — R32 Unspecified urinary incontinence: Secondary | ICD-10-CM | POA: Diagnosis not present

## 2022-11-30 DIAGNOSIS — I951 Orthostatic hypotension: Secondary | ICD-10-CM | POA: Diagnosis not present

## 2022-11-30 DIAGNOSIS — N401 Enlarged prostate with lower urinary tract symptoms: Secondary | ICD-10-CM | POA: Diagnosis not present

## 2022-11-30 DIAGNOSIS — R531 Weakness: Secondary | ICD-10-CM | POA: Diagnosis not present

## 2022-11-30 DIAGNOSIS — N138 Other obstructive and reflux uropathy: Secondary | ICD-10-CM | POA: Diagnosis not present

## 2022-12-04 DIAGNOSIS — N138 Other obstructive and reflux uropathy: Secondary | ICD-10-CM | POA: Diagnosis not present

## 2022-12-04 DIAGNOSIS — N390 Urinary tract infection, site not specified: Secondary | ICD-10-CM | POA: Diagnosis not present

## 2022-12-04 DIAGNOSIS — R339 Retention of urine, unspecified: Secondary | ICD-10-CM | POA: Diagnosis not present

## 2022-12-04 DIAGNOSIS — R5383 Other fatigue: Secondary | ICD-10-CM | POA: Diagnosis not present

## 2022-12-04 DIAGNOSIS — N401 Enlarged prostate with lower urinary tract symptoms: Secondary | ICD-10-CM | POA: Diagnosis not present

## 2022-12-05 ENCOUNTER — Encounter: Payer: Self-pay | Admitting: Oncology

## 2022-12-18 ENCOUNTER — Inpatient Hospital Stay: Payer: Medicare Other | Attending: Oncology

## 2022-12-18 ENCOUNTER — Inpatient Hospital Stay (INDEPENDENT_AMBULATORY_CARE_PROVIDER_SITE_OTHER): Payer: Medicare Other | Admitting: Oncology

## 2022-12-18 VITALS — BP 114/64 | HR 59 | Temp 97.6°F | Resp 18 | Ht 68.0 in | Wt 128.5 lb

## 2022-12-18 DIAGNOSIS — Z79899 Other long term (current) drug therapy: Secondary | ICD-10-CM | POA: Diagnosis not present

## 2022-12-18 DIAGNOSIS — N189 Chronic kidney disease, unspecified: Secondary | ICD-10-CM | POA: Insufficient documentation

## 2022-12-18 DIAGNOSIS — N400 Enlarged prostate without lower urinary tract symptoms: Secondary | ICD-10-CM | POA: Diagnosis not present

## 2022-12-18 DIAGNOSIS — D631 Anemia in chronic kidney disease: Secondary | ICD-10-CM | POA: Diagnosis not present

## 2022-12-18 DIAGNOSIS — N1832 Chronic kidney disease, stage 3b: Secondary | ICD-10-CM | POA: Diagnosis not present

## 2022-12-18 DIAGNOSIS — D539 Nutritional anemia, unspecified: Secondary | ICD-10-CM | POA: Diagnosis not present

## 2022-12-18 LAB — CBC WITH DIFFERENTIAL/PLATELET
Abs Immature Granulocytes: 0.01 10*3/uL (ref 0.00–0.07)
Basophils Absolute: 0 10*3/uL (ref 0.0–0.1)
Basophils Relative: 1 %
Eosinophils Absolute: 0.1 10*3/uL (ref 0.0–0.5)
Eosinophils Relative: 2 %
HCT: 34.6 % — ABNORMAL LOW (ref 39.0–52.0)
Hemoglobin: 10.9 g/dL — ABNORMAL LOW (ref 13.0–17.0)
Immature Granulocytes: 0 %
Lymphocytes Relative: 26 %
Lymphs Abs: 1.6 10*3/uL (ref 0.7–4.0)
MCH: 31.2 pg (ref 26.0–34.0)
MCHC: 31.5 g/dL (ref 30.0–36.0)
MCV: 99.1 fL (ref 80.0–100.0)
Monocytes Absolute: 0.6 10*3/uL (ref 0.1–1.0)
Monocytes Relative: 9 %
Neutro Abs: 4 10*3/uL (ref 1.7–7.7)
Neutrophils Relative %: 62 %
Platelets: 167 10*3/uL (ref 150–400)
RBC: 3.49 MIL/uL — ABNORMAL LOW (ref 4.22–5.81)
RDW: 12.7 % (ref 11.5–15.5)
WBC: 6.3 10*3/uL (ref 4.0–10.5)
nRBC: 0 % (ref 0.0–0.2)

## 2022-12-18 LAB — COMPREHENSIVE METABOLIC PANEL
ALT: 140 U/L — ABNORMAL HIGH (ref 0–44)
AST: 99 U/L — ABNORMAL HIGH (ref 15–41)
Albumin: 4 g/dL (ref 3.5–5.0)
Alkaline Phosphatase: 73 U/L (ref 38–126)
Anion gap: 9 (ref 5–15)
BUN: 43 mg/dL — ABNORMAL HIGH (ref 8–23)
CO2: 25 mmol/L (ref 22–32)
Calcium: 9.2 mg/dL (ref 8.9–10.3)
Chloride: 107 mmol/L (ref 98–111)
Creatinine, Ser: 1.87 mg/dL — ABNORMAL HIGH (ref 0.61–1.24)
GFR, Estimated: 35 mL/min — ABNORMAL LOW (ref >60)
Glucose, Bld: 93 mg/dL (ref 70–99)
Potassium: 4.2 mmol/L (ref 3.5–5.1)
Sodium: 141 mmol/L (ref 135–145)
Total Bilirubin: 0.4 mg/dL (ref 0.3–1.2)
Total Protein: 7.4 g/dL (ref 6.5–8.1)

## 2022-12-18 LAB — FERRITIN: Ferritin: 248 ng/mL (ref 24–336)

## 2022-12-18 MED ORDER — ONDANSETRON HCL 4 MG PO TABS: 4.0000 mg | ORAL_TABLET | Freq: Three times a day (TID) | ORAL | 1 refills | Status: AC | PRN

## 2022-12-18 NOTE — Progress Notes (Signed)
[facility] Cancer Follow up Visit:  Patient Care Team: [name], MD as PCP - General (Internal Medicine) Dulce Sellar Iline Oven, MD as PCP - Cardiology (Cardiology)  CHIEF COMPLAINTS/PURPOSE OF CONSULTATION:  HISTORY OF PRESENTING ILLNESS:  Joshua Parker 82 y.o. male is here because of anemia.  Medical history notable for BPH, coronary artery disease, cervical disc disease, CKD, gallstones, hypertension, hypothyroidism, TIA, vitamin D deficiency, right inguinal hernia, vitamin B12 deficiency  March 09, 2022 WBC 7.5 hemoglobin 9.3 MCV 94 platelet count 227 Ferritin 421 creatinine 1.58 (Cr calculated 30)  April 04, 2022: [facility] Hematology Consult  Can not tolerate oral iron therefore received IV iron about 3 months ago.  Has not required PRBC's in the past.  Developed severe constipation from oral iron.      No reaction to IV iron.  Has a regular diet.  No history of hemorrhage postoperatively requiring transfusion.  No hematochezia, melena, hemoptysis, hematuria.   No history of intra-articular or soft tissue bleeding.  Bruises easily.  Uses Tylenol for pain.  Does not use NSAIDS for painI.  Is not taking oral anticoagulants or antiplatelet drugs.  No history of abnormal bleeding in family members Patient has symptoms of fatigue, pallor,  DOE, decreased performance status.  No history of colonoscopy, or egd and wishes to avoid instrumentation  Social:  Did furniture work and Holiday representative.  Tobacco smoked 40 yrs; quit 20 yrs ago.  EtOH none  WBC 7.3 hemoglobin 9.6 MCV 97 platelet count 143; 67 segs 23 lymphs 9 monos 1 EO leukocyte count 1.1% Coombs test negative haptoglobin 161 INR 1.1 PTT 33 SPEP with IEP showed no monoclonal protein.  Serum free kappa 55.4 lambda 42.6 with a kappa lambda 1.30 IgG 1159 IgA 287 IgM 195 PSA 2.26.  H. pylori stool antigen negative Ferritin 182 folate 15.6 B12 487 Copper 100 zinc 68 CMP notable for creatinine 1.69 BUN  44 Testosterone 143  April 12, 2022: Erythropoietin 20,000 units  April 18, 2022:   Reviewed results of labs with patient and son.  Feels better since receiving Epo.  Discussed how Epo works to help with anemia April 26, 2022: EPO 20,000 units  May 16 2022:    Feels well.  Appetite good.  Weight stable.   Energy good.  Son notices that patient is more active  Ferritin 194.  Hemoglobin 11.2  July 03 2022:  Hgb 10.4 Cr 1.55  Jul 17 2022:  Marland Kitchen More fatigued.  Patient and son are worried that he may require HD.  Discussed indications for HD which he does not currently meet. Patient saw his father go through HD.  Explained that Epo effect is transient and needs to be re-dosed.    Hemoglobin 9.9 creatinine 1.54  Jul 19 2022: Erythropoietin 20,000 units  August 28, 2022: Hemoglobin 10.0 creatinine 1.7  September 25 2022:    Has some pretty good days and some pretty bad days.    Taking a MVI with iron and eating meat in the hope that it improves his energy.  Bad days are usually related to HA's and arthritis.  Has started Cardizem this week per cardiology to manage arrhythmias.    September 28 2022:  Nephrology Consult--Etiology is likely multifactorial in the setting of long-term NSAID use, along with HTN/microvascular disease, BPH/LUTS. Previous paraproteinemia workup by hematology was negative: No monoclonal protein. Serum free kappa 55.4 lambda 42.6 with a kappa lambda 1.30. Olmesartan 40mg  daily discontinued ~2 weeks ago by cardiology due  to hypotension, and switching patient to Cardizem for SVT. -Most recent labs on 09/25/2022 with creatinine 1.7. Electrolytes stable. Will get full CKD serum labs at next appointment. -Will obtain UA, UPC, urine albumin. -Will obtain renal ultrasound. -Instructed to stop NSAID use  October 02 2022: Cardiac echo showed LVEF 55 to 60% with normal wall motion.  Grade 1 diastolic dysfunction.  RV systolic function normal.  No valvular abnormalities  November 23 2022:    Patient has been bedridden for the past 3 days.  Has felt weak, not eating well. Began shaking this morning.  Denies chills, dysuria, cough.  No new pain.  Has been too weak to walk into the office today and was brought in by wheel chair.     WBC 6.0 hemoglobin 11.0 platelet count 145; differential.  normal ferritin 159 BMP notable for creatinine 1.75  November 23 2022:  Patient was referred to ED for further management.    December 18 2022:   Scheduled follow-up for management of anemia.  Has been seen by Urology since last visit.  A foley was placed; attempts to remove it were unsuccessful.  He is off Flomax.  Another attempt at removing the foley to be made at the end of the month. Any consideration for surgery is on hold due to patient frailty.  Fatigued on going from bed to bathroom.  Experiencing more trouble with nausea but not having emesis.  The nausea is helped by ondansetron.  Will write for ondansetron 4 mg.  Has lost 6 lbs over the past 2 months.   Hgb 10.9  Ferritin 248  Cr 1.87   Review of Systems - Oncology  MEDICAL HISTORY: Past Medical History:  Diagnosis Date   Anemia    Anxiety    BPH without obstruction/lower urinary tract symptoms    CAD (coronary artery disease)    Cervical disc disease    CKD (chronic kidney disease), stage III (HCC)    Gallstones    Hypertension    Hypothyroidism    Seizure disorder (HCC)    TIA (transient ischemic attack)    Vitamin B12 deficiency    Vitamin D deficiency     SURGICAL HISTORY: Past Surgical History:  Procedure Laterality Date   BLADDER SURGERY     CATARACT EXTRACTION     CHOLECYSTECTOMY     CYSTOSCOPY WITH INSERTION OF UROLIFT     HERNIA REPAIR     NECK SURGERY     ORIF of Left Arm     Skull Fracture needing surgery     TRANSURETHRAL RESECTION OF PROSTATE      SOCIAL HISTORY: Social History   Socioeconomic History   Marital status: Widowed    Spouse name: Not on file   Number of children: Not on file    Years of education: Not on file   Highest education level: Not on file  Occupational History   Not on file  Tobacco Use   Smoking status: Former   Smokeless tobacco: Former    Types: Designer, multimedia Use   Vaping status: Never Used  Substance and Sexual Activity   Alcohol use: Never   Drug use: Never   Sexual activity: Not on file  Other Topics Concern   Not on file  Social History Narrative   Not on file   Social Determinants of Health   Financial Resource Strain: Not on file  Food Insecurity: Low Risk  (09/28/2022)   Received from [facility]   Hunger  Vital Sign    Worried About Programme researcher, broadcasting/film/video in the Last Year: Never true    Ran Out of Food in the Last Year: Never true  Transportation Needs: Not on file (09/28/2022)  Physical Activity: Not on file  Stress: Not on file  Social Connections: Not on file  Intimate Partner Violence: Not At Risk (04/04/2022)   Humiliation, Afraid, Rape, and Kick questionnaire    Fear of Current or Ex-Partner: No    Emotionally Abused: No    Physically Abused: No    Sexually Abused: No    FAMILY HISTORY Family History  Problem Relation Age of Onset   Diabetes Mother    Prostate cancer Father    Colon cancer Sister    Hypertension Son    Hypothyroidism Son     ALLERGIES:  is allergic to gabapentin, codeine, iron, topiramate, and valproic acid.  MEDICATIONS:  Current Outpatient Medications  Medication Sig Dispense Refill   acetaminophen (TYLENOL) 500 MG tablet Take 500 mg by mouth every 6 (six) hours as needed.     atorvastatin (LIPITOR) 40 MG tablet Take 40 mg by mouth every other day.     Cholecalciferol (VITAMIN D-3) 125 MCG (5000 UT) TABS Take 1 tablet by mouth daily.     cyanocobalamin (,VITAMIN B-12,) 1000 MCG/ML injection Inject 1,000 mcg into the skin every 30 (thirty) days.     famotidine (PEPCID) 40 MG tablet Take 40 mg by mouth daily.     levETIRAcetam (KEPPRA) 500 MG tablet Take 500 mg by mouth 2 (two) times daily.      levothyroxine (SYNTHROID) 88 MCG tablet Take 88 mcg by mouth every morning.     meclizine (ANTIVERT) 12.5 MG tablet Take 12.5 mg by mouth 3 (three) times daily as needed for dizziness.     methocarbamol (ROBAXIN) 500 MG tablet Take 500 mg by mouth.     Multiple Vitamins-Minerals (PRESERVISION AREDS 2 PO) Take by mouth.     ondansetron (ZOFRAN) 4 MG tablet Take 4 mg by mouth every 8 (eight) hours as needed for nausea or vomiting.     Rimegepant Sulfate (NURTEC) 75 MG TBDP Take 1 tablet by mouth daily at 12 noon.     silodosin (RAPAFLO) 8 MG CAPS capsule Take 8 mg by mouth daily with breakfast.     diltiazem (CARDIZEM CD) 120 MG 24 hr capsule Take 1 capsule (120 mg total) by mouth daily. (Patient not taking: Reported on 12/18/2022) 90 capsule 3   tamsulosin (FLOMAX) 0.4 MG CAPS capsule Take 0.4 mg by mouth 2 (two) times daily. (Patient not taking: Reported on 12/18/2022)     No current facility-administered medications for this visit.    PHYSICAL EXAMINATION:  ECOG PERFORMANCE STATUS: 2 - Symptomatic, <50% confined to bed   Vitals:   12/18/22 1305  BP: 114/64  Pulse: (!) 59  Resp: 18  Temp: 97.6 F (36.4 C)  SpO2: 94%      Filed Weights   12/18/22 1305  Weight: 128 lb 8 oz (58.3 kg)       Physical Exam Vitals and nursing note reviewed.  Constitutional:      Appearance: Normal appearance. He is not toxic-appearing or diaphoretic.     Comments: Thin.  Chronically ill appearing.  Temporal wasting  HENT:     Head: Normocephalic and atraumatic.     Right Ear: External ear normal.     Left Ear: External ear normal.     Nose: Nose normal.  Eyes:  General: No scleral icterus.    Conjunctiva/sclera: Conjunctivae normal.     Pupils: Pupils are equal, round, and reactive to light.  Cardiovascular:     Rate and Rhythm: Regular rhythm. Tachycardia present.     Pulses: Normal pulses.     Heart sounds: Normal heart sounds.     No friction rub. No gallop.  Pulmonary:      Effort: Pulmonary effort is normal. No respiratory distress.     Breath sounds: Normal breath sounds. No stridor. No wheezing or rales.  Abdominal:     General: Abdomen is flat.     Tenderness: There is no abdominal tenderness. There is no guarding or rebound.  Genitourinary:    Comments: Has foley draining clear urine Musculoskeletal:        General: No swelling.     Cervical back: Normal range of motion and neck supple. No rigidity or tenderness.     Right lower leg: No edema.     Left lower leg: No edema.     Comments: Decreased muscle mass.  Kyphotic  Lymphadenopathy:     Head:     Right side of head: No submental, submandibular, tonsillar, preauricular, posterior auricular or occipital adenopathy.     Left side of head: No submental, submandibular, tonsillar, preauricular, posterior auricular or occipital adenopathy.     Cervical: No cervical adenopathy.     Right cervical: No superficial, deep or posterior cervical adenopathy.    Left cervical: No superficial, deep or posterior cervical adenopathy.     Upper Body:     Right upper body: No supraclavicular or axillary adenopathy.     Left upper body: No supraclavicular or axillary adenopathy.     Lower Body: No right inguinal adenopathy. No left inguinal adenopathy.  Skin:    Coloration: Skin is not jaundiced or pale.     Findings: Bruising present.  Neurological:     General: No focal deficit present.     Mental Status: He is alert and oriented to person, place, and time.     Comments: Decreased hearing  Psychiatric:        Mood and Affect: Mood normal.        Behavior: Behavior normal.        Thought Content: Thought content normal.        Judgment: Judgment normal.      LABORATORY DATA: I have personally reviewed the data as listed:  Appointment on 11/20/2022  Component Date Value Ref Range Status   Ferritin 11/20/2022 159  24 - 336 ng/mL Final   Performed at [facility], [address],  [place], [place] [address]   Sodium 11/20/2022 140  135 - 145 mmol/L Final   Potassium 11/20/2022 4.3  3.5 - 5.1 mmol/L Final   Chloride 11/20/2022 108  98 - 111 mmol/L Final   CO2 11/20/2022 25  22 - 32 mmol/L Final   Glucose, Bld 11/20/2022 92  70 - 99 mg/dL Final   Glucose reference range applies only to samples taken after fasting for at least 8 hours.   BUN 11/20/2022 39 (H)  8 - 23 mg/dL Final   Creatinine, Ser 11/20/2022 1.75 (H)  0.61 - 1.24 mg/dL Final   Calcium 78/46/9629 8.8 (L)  8.9 - 10.3 mg/dL Final   GFR, Estimated 11/20/2022 38 (L)  >60 mL/min Final   Comment: (NOTE) Calculated using the CKD-EPI Creatinine Equation (2021)    Anion gap 11/20/2022 7  5 - 15 Final  Performed at [facility], [address], [place], [place] [address]   WBC 11/20/2022 6.0  4.0 - 10.5 K/uL Final   RBC 11/20/2022 3.55 (L)  4.22 - 5.81 MIL/uL Final   Hemoglobin 11/20/2022 11.0 (L)  13.0 - 17.0 g/dL Final   HCT 60/45/4098 34.4 (L)  39.0 - 52.0 % Final   MCV 11/20/2022 96.9  80.0 - 100.0 fL Final   MCH 11/20/2022 31.0  26.0 - 34.0 pg Final   MCHC 11/20/2022 32.0  30.0 - 36.0 g/dL Final   RDW 11/91/4782 13.2  11.5 - 15.5 % Final   Platelets 11/20/2022 145 (L)  150 - 400 K/uL Final   nRBC 11/20/2022 0.0  0.0 - 0.2 % Final   Neutrophils Relative % 11/20/2022 60  % Final   Neutro Abs 11/20/2022 3.6  1.7 - 7.7 K/uL Final   Lymphocytes Relative 11/20/2022 31  % Final   Lymphs Abs 11/20/2022 1.9  0.7 - 4.0 K/uL Final   Monocytes Relative 11/20/2022 8  % Final   Monocytes Absolute 11/20/2022 0.5  0.1 - 1.0 K/uL Final   Eosinophils Relative 11/20/2022 1  % Final   Eosinophils Absolute 11/20/2022 0.1  0.0 - 0.5 K/uL Final   Basophils Relative 11/20/2022 0  % Final   Basophils Absolute 11/20/2022 0.0  0.0 - 0.1 K/uL Final   Immature Granulocytes 11/20/2022 0  % Final   Abs Immature Granulocytes 11/20/2022 0.01  0.00 - 0.07 K/uL Final   Performed at [facility], [address], [place], [place] [address]    RADIOGRAPHIC STUDIES: I have personally reviewed the radiological images as listed and agree with the findings in the report  No results found.  ASSESSMENT/PLAN  Anemia:  Multifactorial.  Main contributions are CKD (Cr Clearance 30) and low testosterone level April 12 2021:  Epo 20 000 units.   April 18 2021:  Following Hgb and ferritin levels.  May ultimately require IV iron as stores mobilized to make Hgb.  Holding on testosterone replacement due to thrombotic risk May 16 2022:  Hgb improved and ferritin adequate.  Will continue Epo.   Jul 17 2022- Hgb has trended downward due to recent ruling by CMS which precludes use unless Hgb < 10.0.  Will continue to follow Hgb Jul 19 2022- Erythropoietin 20,000 units  September 20 2022- Check CBC with diff monthly.  Only requiring Epo intermittently October 25 2022- Epo 20 000 November 20 2022- Hgb 11.0.  Cr 1.75.  Ferritin 159 November 23 2022:  Hgb is improved with use of ESA.  His degree of anemia does not account for fatigue.   December 18 2022:  Hgb 10.9 Ferritin 248 Cr 1.87   Hypothyroidism:  On replacement.  Can contribute to anemia  Bruising:  Can be due to skin fragility.   April 04 2022:   PLT count, PT/PTT are normal  Frail elderly:  Limits tolerance to instrumentation  September 20 2022- Discussed various causes of fatigue with patient and son- anemia, cardiac, CKD,  chronic pain.    Chronic kidney disease  Jul 17 2022- Discussed possible causes with patient and son, at their request.  Assured them the he does not meet criteria for requiring HD at this time  September 28 2022- Nephrology Consult.   Etiology is likely multifactorial in the setting of long-term NSAID use, along with HTN/microvascular disease, BPH/LUTS   December 18 2022- Cr 1.87 Will continue to follow    Cancer Staging  No matching staging information was found for the patient.    No problem-specific Assessment & Plan  notes found for this encounter.   No orders of the defined types were placed in this encounter.  30  minutes was spent in patient care.  This included time spent preparing to see the patient (e.g., review of tests), obtaining and/or reviewing separately obtained history, counseling and educating the patient/family/caregiver, ordering medications, tests, or procedures; documenting clinical information in the electronic or other health record, independently interpreting results and communicating results to the patient/family/caregiver as well as coordination of care.       All questions were answered. The patient knows to call the clinic with any problems, questions or concerns.  This note was electronically signed.    Loni Muse, MD  12/18/2022 1:07 PM

## 2022-12-20 ENCOUNTER — Inpatient Hospital Stay: Payer: Medicare Other

## 2022-12-26 DIAGNOSIS — Z6820 Body mass index (BMI) 20.0-20.9, adult: Secondary | ICD-10-CM | POA: Diagnosis not present

## 2022-12-26 DIAGNOSIS — M5030 Other cervical disc degeneration, unspecified cervical region: Secondary | ICD-10-CM | POA: Diagnosis not present

## 2022-12-26 DIAGNOSIS — D509 Iron deficiency anemia, unspecified: Secondary | ICD-10-CM | POA: Diagnosis not present

## 2022-12-26 DIAGNOSIS — R110 Nausea: Secondary | ICD-10-CM | POA: Diagnosis not present

## 2022-12-26 DIAGNOSIS — E039 Hypothyroidism, unspecified: Secondary | ICD-10-CM | POA: Diagnosis not present

## 2022-12-26 DIAGNOSIS — R519 Headache, unspecified: Secondary | ICD-10-CM | POA: Diagnosis not present

## 2022-12-26 DIAGNOSIS — N401 Enlarged prostate with lower urinary tract symptoms: Secondary | ICD-10-CM | POA: Diagnosis not present

## 2022-12-28 DIAGNOSIS — N189 Chronic kidney disease, unspecified: Secondary | ICD-10-CM | POA: Diagnosis not present

## 2022-12-28 DIAGNOSIS — R339 Retention of urine, unspecified: Secondary | ICD-10-CM | POA: Diagnosis not present

## 2022-12-28 DIAGNOSIS — N400 Enlarged prostate without lower urinary tract symptoms: Secondary | ICD-10-CM | POA: Diagnosis not present

## 2022-12-29 ENCOUNTER — Encounter: Payer: Self-pay | Admitting: Oncology

## 2023-01-18 ENCOUNTER — Encounter: Payer: Self-pay | Admitting: Cardiology

## 2023-01-18 NOTE — Progress Notes (Signed)
Cardiology Office Note:    Date:  01/19/2023   ID:  Joshua Parker, DOB [birth year], MRN [id number]  PCP:  [name], MD  Cardiologist:  Norman Herrlich, MD    Referring MD: [name], MD    ASSESSMENT:    1. Hypertensive heart disease without heart failure   2. Stage 3 chronic kidney disease, unspecified whether stage 3a or 3b CKD (HCC)   3. SVT (supraventricular tachycardia) (HCC)    PLAN:    In order of problems listed above:  Much improved after stopping Cardizem and tamsulosin,  Previously edema is related to his calcium channel blocker Subsequent hypotension completely off of this I agree Stable CKD I doubt he requires any ongoing therapy for atrial arrhythmia but I gave the son a prescription that he can give him a short acting dose of calcium channel blocker if he had symptomatic SVT  Next appointment: 1 year   Medication Adjustments/Labs and Tests Ordered: Current medicines are reviewed at length with the patient today.  Concerns regarding medicines are outlined above.  No orders of the defined types were placed in this encounter.  No orders of the defined types were placed in this encounter.    History of Present Illness:    Joshua Parker is a 82 y.o. male with a hx of hypertensive heart disease with heart failure and CKD last seen 10/19/2022.  Previous event monitor showed brief episodes of SVT rates 150 bpm the longest 11 complexes.'s echocardiogram showed mild LVH preserved ejection fraction normal right ventricular size and function mild aortic regurgitation.  He was placed on Cardizem he is taken an alpha-blocker he developed fairly severe symptomatic hypotension he is now improved His son thinks that he is doing well He is not having complaints of edema shortness of breath chest pain palpitation or syncope He questions whether he requires any treatment for his atrial arrhythmia and I think what I will do is give him a prescription he  can take on appearing basis if he had symptomatic tachycardia and his son will purchase a pulse meter to monitor  Compliance with diet, lifestyle and medications: Yes  Recent labs 12/18/2022 mildly anemic hemoglobin 10.9 creatinine 1.87 potassium 4.2 Past Medical History:  Diagnosis Date   Anemia    Anxiety    BPH without obstruction/lower urinary tract symptoms    CAD (coronary artery disease)    Cervical disc disease    CKD (chronic kidney disease), stage III (HCC)    Gallstones    Hypertension    Hypothyroidism    Seizure disorder (HCC)    TIA (transient ischemic attack)    Vitamin B12 deficiency    Vitamin D deficiency     Current Medications: Current Meds  Medication Sig   acetaminophen (TYLENOL) 500 MG tablet Take 500 mg by mouth every 6 (six) hours as needed for mild pain (pain score 1-3) or moderate pain (pain score 4-6).   atorvastatin (LIPITOR) 40 MG tablet Take 40 mg by mouth every other day.   Cholecalciferol (VITAMIN D-3) 125 MCG (5000 UT) TABS Take 1 tablet by mouth daily.   cyanocobalamin (,VITAMIN B-12,) 1000 MCG/ML injection Inject 1,000 mcg into the skin every 30 (thirty) days.   famotidine (PEPCID) 40 MG tablet Take 40 mg by mouth daily.   levETIRAcetam (KEPPRA) 500 MG tablet Take 500 mg by mouth 2 (two) times daily.   levothyroxine (SYNTHROID) 88 MCG tablet Take 88 mcg by mouth every morning.   meclizine (ANTIVERT) 12.5  MG tablet Take 12.5 mg by mouth 3 (three) times daily as needed for dizziness.   methocarbamol (ROBAXIN) 500 MG tablet Take 500 mg by mouth every 8 (eight) hours as needed for muscle spasms.   Multiple Vitamins-Minerals (PRESERVISION AREDS 2 PO) Take 1 tablet by mouth daily.   ondansetron (ZOFRAN) 4 MG tablet Take 1 tablet (4 mg total) by mouth every 8 (eight) hours as needed for nausea or vomiting.   Rimegepant Sulfate (NURTEC) 75 MG TBDP Take 1 tablet by mouth daily at 12 noon.   silodosin (RAPAFLO) 8 MG CAPS capsule Take 8 mg by mouth daily  with breakfast.      EKGs/Labs/Other Studies Reviewed:    The following studies were reviewed today:  Cardiac Studies & Procedures       ECHOCARDIOGRAM  ECHOCARDIOGRAM COMPLETE 10/02/2022  Narrative ECHOCARDIOGRAM REPORT    Patient Name:   Joshua Parker Feigel Date of Exam: 10/02/2022 Medical Rec #:  [id number]         Height:       68.0 in Accession #:    [id number]        Weight:       127.0 lb Date of Birth:  1940-12-11         BSA:          1.685 m Patient Age:    82 years          BP:           157/85 mmHg Patient Gender: M                 HR:           63 bpm. Exam Location:    Procedure: 2D Echo, Color Doppler, Cardiac Doppler and Strain Analysis  Indications:    Dyspnea on exertion [R06.09 (ICD-10-CM)]  History:        Patient has no prior history of Echocardiogram examinations. CAD, TIA; Risk Factors:Hypertension.  Sonographer:    Louie Boston RDCS Referring Phys: [phone] Darden Dates WOODY  IMPRESSIONS   1. Left ventricular ejection fraction, by estimation, is 55 to 60%. The left ventricle has normal function. The left ventricle has no regional wall motion abnormalities. There is mild concentric left ventricular hypertrophy. Left ventricular diastolic parameters are consistent with Grade I diastolic dysfunction (impaired relaxation). The average left ventricular global longitudinal strain is -16.9 %. The global longitudinal strain is abnormal. 2. Right ventricular systolic function is normal. The right ventricular size is normal. There is normal pulmonary artery systolic pressure. 3. The mitral valve is degenerative. No evidence of mitral valve regurgitation. No evidence of mitral stenosis. 4. The aortic valve is tricuspid. Aortic valve regurgitation is mild. Aortic valve sclerosis is present, with no evidence of aortic valve stenosis. 5. Aortic Normal DTA. 6. The inferior vena cava is normal in size with greater than 50% respiratory variability, suggesting right  atrial pressure of 3 mmHg.  FINDINGS Left Ventricle: Left ventricular ejection fraction, by estimation, is 55 to 60%. The left ventricle has normal function. The left ventricle has no regional wall motion abnormalities. The average left ventricular global longitudinal strain is -16.9 %. The global longitudinal strain is abnormal. The left ventricular internal cavity size was normal in size. There is mild concentric left ventricular hypertrophy. Left ventricular diastolic parameters are consistent with Grade I diastolic dysfunction (impaired relaxation). Normal left ventricular filling pressure.  Right Ventricle: The right ventricular size is normal. No increase in right ventricular wall thickness. Right  ventricular systolic function is normal. There is normal pulmonary artery systolic pressure. The tricuspid regurgitant velocity is 2.32 m/s, and with an assumed right atrial pressure of 3 mmHg, the estimated right ventricular systolic pressure is 24.5 mmHg.  Left Atrium: Left atrial size was normal in size.  Right Atrium: Right atrial size was normal in size.  Pericardium: There is no evidence of pericardial effusion.  Mitral Valve: The mitral valve is degenerative in appearance. Mild mitral annular calcification. No evidence of mitral valve regurgitation. No evidence of mitral valve stenosis.  Tricuspid Valve: The tricuspid valve is normal in structure. Tricuspid valve regurgitation is mild . No evidence of tricuspid stenosis.  Aortic Valve: The aortic valve is tricuspid. Aortic valve regurgitation is mild. Aortic regurgitation PHT measures 802 msec. Aortic valve sclerosis is present, with no evidence of aortic valve stenosis.  Pulmonic Valve: The pulmonic valve was normal in structure. Pulmonic valve regurgitation is mild. No evidence of pulmonic stenosis.  Aorta: The aortic root and ascending aorta are structurally normal, with no evidence of dilitation, the aortic arch was not well  visualized and Normal DTA.  Venous: The inferior vena cava is normal in size with greater than 50% respiratory variability, suggesting right atrial pressure of 3 mmHg.  IAS/Shunts: No atrial level shunt detected by color flow Doppler.   LEFT VENTRICLE PLAX 2D LVIDd:         3.80 cm   Diastology LVIDs:         2.50 cm   LV e' medial:    6.09 cm/s LV PW:         1.10 cm   LV E/e' medial:  12.1 LV IVS:        1.10 cm   LV e' lateral:   6.64 cm/s LVOT diam:     2.00 cm   LV E/e' lateral: 11.1 LV SV:         80 LV SV Index:   47        2D Longitudinal Strain LVOT Area:     3.14 cm  2D Strain GLS Avg:     -16.9 %   RIGHT VENTRICLE             IVC RV Basal diam:  3.60 cm     IVC diam: 1.80 cm RV S prime:     10.10 cm/s TAPSE (M-mode): 2.1 cm  LEFT ATRIUM             Index        RIGHT ATRIUM           Index LA diam:        3.40 cm 2.02 cm/m   RA Area:     12.90 cm LA Vol (A2C):   37.8 ml 22.43 ml/m  RA Volume:   29.30 ml  17.39 ml/m LA Vol (A4C):   43.5 ml 25.82 ml/m LA Biplane Vol: 40.3 ml 23.92 ml/m AORTIC VALVE LVOT Vmax:   118.00 cm/s LVOT Vmean:  73.100 cm/s LVOT VTI:    0.254 m AI PHT:      802 msec  AORTA Ao Root diam: 3.60 cm Ao Asc diam:  2.90 cm Ao Desc diam: 2.20 cm  MV E velocity: 73.40 cm/s   TRICUSPID VALVE MV A velocity: 113.00 cm/s  TR Peak grad:   21.5 mmHg MV E/A ratio:  0.65         TR Vmax:        232.00 cm/s  SHUNTS  Systemic VTI:  0.25 m Systemic Diam: 2.00 cm  Norman Herrlich MD Electronically signed by Norman Herrlich MD Signature Date/Time: 10/02/2022/12:19:49 PM    Final    MONITORS  LONG TERM MONITOR (3-14 DAYS) 09/13/2022  Narrative Patch Wear Time:  13 days and 23 hours (2024-06-14T10:50:32-0400 to 2024-06-28T10:50:24-0400)  Patient had a min HR of 43 bpm, max HR of 150 bpm, and avg HR of 63 bpm. Predominant underlying rhythm was Sinus Rhythm.  There were no symptomatic events.  There were no pauses of 3 seconds or greater and no  episodes of second or third-degree AV nodal block.  10 Supraventricular Tachycardia runs occurred, the run with the fastest interval lasting 6 beats with a max rate of 150 bpm, the longest lasting 11 beats with an avg rate of 109 bpm.  There were no episodes of atrial fibrillation or flutter.  Isolated SVEs were frequent (6.5%, [id number]), SVE Couplets were rare (<1.0%, 5490), and SVE Triplets were rare (<1.0%, 1241).  Isolated VEs were rare (<1.0%), VE Couplets were rare (<1.0%), and no VE Triplets were present.               Recent Labs: 08/18/2022: TSH 2.950 12/18/2022: ALT 140; BUN 43; Creatinine, Ser 1.87; Hemoglobin 10.9; Platelets 167; Potassium 4.2; Sodium 141    Physical Exam:    VS:  Ht 5\' 8"  (1.727 m)   Wt 130 lb 9.6 oz (59.2 kg)   SpO2 99%   BMI 19.86 kg/m     Wt Readings from Last 3 Encounters:  01/19/23 130 lb 9.6 oz (59.2 kg)  12/18/22 128 lb 8 oz (58.3 kg)  11/23/22 130 lb (59 kg)     GEN: He looks frail very small stature quite quiet well nourished, well developed in no acute distress HEENT: Normal NECK: No JVD; No carotid bruits LYMPHATICS: No lymphadenopathy CARDIAC: RRR, no murmurs, rubs, gallops RESPIRATORY:  Clear to auscultation without rales, wheezing or rhonchi  ABDOMEN: Soft, non-tender, non-distended MUSCULOSKELETAL:  No edema; No deformity  SKIN: Warm and dry NEUROLOGIC:  Alert and oriented x 3 PSYCHIATRIC:  Normal affect    Signed, Norman Herrlich, MD  01/19/2023 11:34 AM    [facility]

## 2023-01-19 ENCOUNTER — Encounter: Payer: Self-pay | Admitting: Cardiology

## 2023-01-19 ENCOUNTER — Ambulatory Visit: Payer: Medicare Other | Attending: Cardiology | Admitting: Cardiology

## 2023-01-19 VITALS — Ht 68.0 in | Wt 130.6 lb

## 2023-01-19 DIAGNOSIS — I119 Hypertensive heart disease without heart failure: Secondary | ICD-10-CM | POA: Diagnosis not present

## 2023-01-19 DIAGNOSIS — N183 Chronic kidney disease, stage 3 unspecified: Secondary | ICD-10-CM | POA: Diagnosis not present

## 2023-01-19 DIAGNOSIS — I471 Supraventricular tachycardia, unspecified: Secondary | ICD-10-CM | POA: Diagnosis not present

## 2023-01-19 MED ORDER — DILTIAZEM HCL 30 MG PO TABS: ORAL_TABLET | ORAL | 0 refills | Status: AC

## 2023-01-19 NOTE — Patient Instructions (Signed)
Medication Instructions:  Your physician has recommended you make the following change in your medication:   Cardizem 30 mg once daily if heart rate greater than 125 bpm  *If you need a refill on your cardiac medications before your next appointment, please call your pharmacy*   Lab Work: None If you have labs (blood work) drawn today and your tests are completely normal, you will receive your results only by: MyChart Message (if you have MyChart) OR A paper copy in the mail If you have any lab test that is abnormal or we need to change your treatment, we will call you to review the results.   Testing/Procedures: None   Follow-Up: At [facility], you and your health needs are our priority.  As part of our continuing mission to provide you with exceptional heart care, we have created designated Provider Care Teams.  These Care Teams include your primary Cardiologist (physician) and Advanced Practice Providers (APPs -  Physician Assistants and Nurse Practitioners) who all work together to provide you with the care you need, when you need it.  We recommend signing up for the patient portal called "MyChart".  Sign up information is provided on this After Visit Summary.  MyChart is used to connect with patients for Virtual Visits (Telemedicine).  Patients are able to view lab/test results, encounter notes, upcoming appointments, etc.  Non-urgent messages can be sent to your provider as well.   To learn more about what you can do with MyChart, go to [email or web address].    Your next appointment:   1 year(s)  Provider:   Norman Herrlich, MD    Other Instructions None

## 2023-01-22 DIAGNOSIS — R339 Retention of urine, unspecified: Secondary | ICD-10-CM | POA: Diagnosis not present

## 2023-01-22 DIAGNOSIS — N138 Other obstructive and reflux uropathy: Secondary | ICD-10-CM | POA: Diagnosis not present

## 2023-01-22 DIAGNOSIS — R32 Unspecified urinary incontinence: Secondary | ICD-10-CM | POA: Diagnosis not present

## 2023-01-22 DIAGNOSIS — N401 Enlarged prostate with lower urinary tract symptoms: Secondary | ICD-10-CM | POA: Diagnosis not present

## 2023-01-29 ENCOUNTER — Telehealth: Payer: Self-pay | Admitting: Oncology

## 2023-01-29 ENCOUNTER — Inpatient Hospital Stay: Payer: Medicare Other

## 2023-01-29 ENCOUNTER — Inpatient Hospital Stay: Payer: Medicare Other | Attending: Oncology | Admitting: Oncology

## 2023-01-29 VITALS — BP 122/73 | HR 65 | Temp 98.2°F | Resp 16 | Ht 68.0 in | Wt 132.4 lb

## 2023-01-29 DIAGNOSIS — Z79899 Other long term (current) drug therapy: Secondary | ICD-10-CM | POA: Diagnosis not present

## 2023-01-29 DIAGNOSIS — E538 Deficiency of other specified B group vitamins: Secondary | ICD-10-CM | POA: Diagnosis not present

## 2023-01-29 DIAGNOSIS — I129 Hypertensive chronic kidney disease with stage 1 through stage 4 chronic kidney disease, or unspecified chronic kidney disease: Secondary | ICD-10-CM | POA: Insufficient documentation

## 2023-01-29 DIAGNOSIS — N189 Chronic kidney disease, unspecified: Secondary | ICD-10-CM | POA: Diagnosis not present

## 2023-01-29 DIAGNOSIS — N1832 Chronic kidney disease, stage 3b: Secondary | ICD-10-CM

## 2023-01-29 DIAGNOSIS — D539 Nutritional anemia, unspecified: Secondary | ICD-10-CM

## 2023-01-29 DIAGNOSIS — D631 Anemia in chronic kidney disease: Secondary | ICD-10-CM | POA: Diagnosis not present

## 2023-01-29 DIAGNOSIS — E559 Vitamin D deficiency, unspecified: Secondary | ICD-10-CM | POA: Insufficient documentation

## 2023-01-29 DIAGNOSIS — Z800 Family history of malignant neoplasm of digestive organs: Secondary | ICD-10-CM | POA: Diagnosis not present

## 2023-01-29 DIAGNOSIS — R54 Age-related physical debility: Secondary | ICD-10-CM | POA: Diagnosis not present

## 2023-01-29 LAB — CBC WITH DIFFERENTIAL (CANCER CENTER ONLY)
Abs Immature Granulocytes: 0.01 10*3/uL (ref 0.00–0.07)
Basophils Absolute: 0 10*3/uL (ref 0.0–0.1)
Basophils Relative: 0 %
Eosinophils Absolute: 0.2 10*3/uL (ref 0.0–0.5)
Eosinophils Relative: 3 %
HCT: 29.8 % — ABNORMAL LOW (ref 39.0–52.0)
Hemoglobin: 10 g/dL — ABNORMAL LOW (ref 13.0–17.0)
Immature Granulocytes: 0 %
Lymphocytes Relative: 24 %
Lymphs Abs: 1.5 10*3/uL (ref 0.7–4.0)
MCH: 30.9 pg (ref 26.0–34.0)
MCHC: 33.6 g/dL (ref 30.0–36.0)
MCV: 92 fL (ref 80.0–100.0)
Monocytes Absolute: 0.5 10*3/uL (ref 0.1–1.0)
Monocytes Relative: 8 %
Neutro Abs: 3.8 10*3/uL (ref 1.7–7.7)
Neutrophils Relative %: 65 %
Platelet Count: 138 10*3/uL — ABNORMAL LOW (ref 150–400)
RBC: 3.24 MIL/uL — ABNORMAL LOW (ref 4.22–5.81)
RDW: 13.1 % (ref 11.5–15.5)
WBC Count: 6 10*3/uL (ref 4.0–10.5)
nRBC: 0 % (ref 0.0–0.2)
nRBC: 0 /100{WBCs}

## 2023-01-29 LAB — BASIC METABOLIC PANEL
Anion gap: 12 (ref 5–15)
BUN: 32 mg/dL — ABNORMAL HIGH (ref 8–23)
CO2: 20 mmol/L — ABNORMAL LOW (ref 22–32)
Calcium: 9.8 mg/dL (ref 8.9–10.3)
Chloride: 110 mmol/L (ref 98–111)
Creatinine, Ser: 1.97 mg/dL — ABNORMAL HIGH (ref 0.61–1.24)
GFR, Estimated: 33 mL/min — ABNORMAL LOW (ref >60)
Glucose, Bld: 92 mg/dL (ref 70–99)
Potassium: 4 mmol/L (ref 3.5–5.1)
Sodium: 142 mmol/L (ref 135–145)

## 2023-01-29 LAB — FERRITIN: Ferritin: 157 ng/mL (ref 24–336)

## 2023-01-29 NOTE — Progress Notes (Unsigned)
[facility] Cancer Follow up Visit:  Patient Care Team: [name], MD as PCP - General (Internal Medicine) Dulce Sellar Iline Oven, MD as PCP - Cardiology (Cardiology)  CHIEF COMPLAINTS/PURPOSE OF CONSULTATION:  HISTORY OF PRESENTING ILLNESS:  Joshua Parker 82 y.o. male is here because of anemia.  Medical history notable for BPH, coronary artery disease, cervical disc disease, CKD, gallstones, hypertension, hypothyroidism, TIA, vitamin D deficiency, right inguinal hernia, vitamin B12 deficiency  March 09, 2022 WBC 7.5 hemoglobin 9.3 MCV 94 platelet count 227 Ferritin 421 creatinine 1.58 (Cr calculated 30)  April 04, 2022: [facility] Hematology Consult  Can not tolerate oral iron therefore received IV iron about 3 months ago.  Has not required PRBC's in the past.  Developed severe constipation from oral iron.      No reaction to IV iron.  Has a regular diet.  No history of hemorrhage postoperatively requiring transfusion.  No hematochezia, melena, hemoptysis, hematuria.   No history of intra-articular or soft tissue bleeding.  Bruises easily.  Uses Tylenol for pain.  Does not use NSAIDS for painI.  Is not taking oral anticoagulants or antiplatelet drugs.  No history of abnormal bleeding in family members Patient has symptoms of fatigue, pallor,  DOE, decreased performance status.  No history of colonoscopy, or egd and wishes to avoid instrumentation  Social:  Did furniture work and Holiday representative.  Tobacco smoked 40 yrs; quit 20 yrs ago.  EtOH none  WBC 7.3 hemoglobin 9.6 MCV 97 platelet count 143; 67 segs 23 lymphs 9 monos 1 EO leukocyte count 1.1% Coombs test negative haptoglobin 161 INR 1.1 PTT 33 SPEP with IEP showed no monoclonal protein.  Serum free kappa 55.4 lambda 42.6 with a kappa lambda 1.30 IgG 1159 IgA 287 IgM 195 PSA 2.26.  H. pylori stool antigen negative Ferritin 182 folate 15.6 B12 487 Copper 100 zinc 68 CMP notable for creatinine 1.69 BUN  44 Testosterone 143  April 12, 2022: Erythropoietin 20,000 units  April 18, 2022:   Reviewed results of labs with patient and son.  Feels better since receiving Epo.  Discussed how Epo works to help with anemia April 26, 2022: EPO 20,000 units  May 16 2022:    Feels well.  Appetite good.  Weight stable.   Energy good.  Son notices that patient is more active  Ferritin 194.  Hemoglobin 11.2  July 03 2022:  Hgb 10.4 Cr 1.55  Jul 17 2022:  Marland Kitchen More fatigued.  Patient and son are worried that he may require HD.  Discussed indications for HD which he does not currently meet. Patient saw his father go through HD.  Explained that Epo effect is transient and needs to be re-dosed.    Hemoglobin 9.9 creatinine 1.54  Jul 19 2022: Erythropoietin 20,000 units  August 28, 2022: Hemoglobin 10.0 creatinine 1.7  September 25 2022:    Has some pretty good days and some pretty bad days.    Taking a MVI with iron and eating meat in the hope that it improves his energy.  Bad days are usually related to HA's and arthritis.  Has started Cardizem this week per cardiology to manage arrhythmias.    September 28 2022:  Nephrology Consult--Etiology is likely multifactorial in the setting of long-term NSAID use, along with HTN/microvascular disease, BPH/LUTS. Previous paraproteinemia workup by hematology was negative: No monoclonal protein. Serum free kappa 55.4 lambda 42.6 with a kappa lambda 1.30. Olmesartan 40mg  daily discontinued ~2 weeks ago by cardiology due  to hypotension, and switching patient to Cardizem for SVT. -Most recent labs on 09/25/2022 with creatinine 1.7. Electrolytes stable. Will get full CKD serum labs at next appointment. -Will obtain UA, UPC, urine albumin. -Will obtain renal ultrasound. -Instructed to stop NSAID use  October 02 2022: Cardiac echo showed LVEF 55 to 60% with normal wall motion.  Grade 1 diastolic dysfunction.  RV systolic function normal.  No valvular abnormalities  November 23 2022:    Patient has been bedridden for the past 3 days.  Has felt weak, not eating well. Began shaking this morning.  Denies chills, dysuria, cough.  No new pain.  Has been too weak to walk into the office today and was brought in by wheel chair.     WBC 6.0 hemoglobin 11.0 platelet count 145; differential.  normal ferritin 159 BMP notable for creatinine 1.75  November 23 2022:  Patient was referred to ED for further management.    December 18 2022:  Has been seen by Urology since last visit.  A foley was placed; attempts to remove it were unsuccessful.  He is off Flomax.  Another attempt at removing the foley to be made at the end of the month. Any consideration for surgery is on hold due to patient frailty.  Fatigued on going from bed to bathroom.  Experiencing more trouble with nausea but not having emesis.  The nausea is helped by ondansetron.  Will write for ondansetron 4 mg.  Has lost 6 lbs over the past 2 months.   Hgb 10.9  Ferritin 248  Cr 1.87   January 19 2023:  Cardiology follow up visit  January 29 2023:  Scheduled follow-up for management of anemia.  Foley removed since last visit.  Feels better off Flomax and Cardizem.  To undergo cystoscopy in about 2 weeks.  Nausea helped by ondansetron.  Has gained 2 lbs.   Hgb 10.0 Ferritin 157  Review of Systems - Oncology  MEDICAL HISTORY: Past Medical History:  Diagnosis Date   Anemia    Anxiety    BPH without obstruction/lower urinary tract symptoms    CAD (coronary artery disease)    Cervical disc disease    CKD (chronic kidney disease), stage III (HCC)    Gallstones    Hypertension    Hypothyroidism    Seizure disorder (HCC)    TIA (transient ischemic attack)    Vitamin B12 deficiency    Vitamin D deficiency     SURGICAL HISTORY: Past Surgical History:  Procedure Laterality Date   BLADDER SURGERY     CATARACT EXTRACTION     CHOLECYSTECTOMY     CYSTOSCOPY WITH INSERTION OF UROLIFT     HERNIA REPAIR     NECK  SURGERY     ORIF of Left Arm     Skull Fracture needing surgery     TRANSURETHRAL RESECTION OF PROSTATE      SOCIAL HISTORY: Social History   Socioeconomic History   Marital status: Widowed    Spouse name: Not on file   Number of children: Not on file   Years of education: Not on file   Highest education level: Not on file  Occupational History   Not on file  Tobacco Use   Smoking status: Former   Smokeless tobacco: Former    Types: Designer, multimedia Use   Vaping status: Never Used  Substance and Sexual Activity   Alcohol use: Never   Drug use: Never   Sexual activity: Not on  file  Other Topics Concern   Not on file  Social History Narrative   Not on file   Social Determinants of Health   Financial Resource Strain: Not on file  Food Insecurity: Low Risk  (09/28/2022)   Received from [facility]   Hunger Vital Sign    Worried About Running Out of Food in the Last Year: Never true    Ran Out of Food in the Last Year: Never true  Transportation Needs: Not on file (09/28/2022)  Physical Activity: Not on file  Stress: Not on file  Social Connections: Not on file  Intimate Partner Violence: Not At Risk (04/04/2022)   Humiliation, Afraid, Rape, and Kick questionnaire    Fear of Current or Ex-Partner: No    Emotionally Abused: No    Physically Abused: No    Sexually Abused: No    FAMILY HISTORY Family History  Problem Relation Age of Onset   Diabetes Mother    Prostate cancer Father    Colon cancer Sister    Hypertension Son    Hypothyroidism Son     ALLERGIES:  is allergic to gabapentin, codeine, iron, topiramate, and valproic acid.  MEDICATIONS:  Current Outpatient Medications  Medication Sig Dispense Refill   acetaminophen (TYLENOL) 500 MG tablet Take 500 mg by mouth every 6 (six) hours as needed for mild pain (pain score 1-3) or moderate pain (pain score 4-6).     atorvastatin (LIPITOR) 40 MG tablet Take 40 mg by mouth every other day.     Cholecalciferol  (VITAMIN D-3) 125 MCG (5000 UT) TABS Take 1 tablet by mouth daily.     cyanocobalamin (,VITAMIN B-12,) 1000 MCG/ML injection Inject 1,000 mcg into the skin every 30 (thirty) days.     diltiazem (CARDIZEM CD) 120 MG 24 hr capsule Take 1 capsule (120 mg total) by mouth daily. (Patient not taking: Reported on 01/19/2023) 90 capsule 3   diltiazem (CARDIZEM) 30 MG tablet Take once daily if if heart rate greater than 125 bpm 10 tablet 0   famotidine (PEPCID) 40 MG tablet Take 40 mg by mouth daily.     levETIRAcetam (KEPPRA) 500 MG tablet Take 500 mg by mouth 2 (two) times daily.     levothyroxine (SYNTHROID) 88 MCG tablet Take 88 mcg by mouth every morning.     meclizine (ANTIVERT) 12.5 MG tablet Take 12.5 mg by mouth 3 (three) times daily as needed for dizziness.     methocarbamol (ROBAXIN) 500 MG tablet Take 500 mg by mouth every 8 (eight) hours as needed for muscle spasms.     Multiple Vitamins-Minerals (PRESERVISION AREDS 2 PO) Take 1 tablet by mouth daily.     ondansetron (ZOFRAN) 4 MG tablet Take 1 tablet (4 mg total) by mouth every 8 (eight) hours as needed for nausea or vomiting. 60 tablet 1   Rimegepant Sulfate (NURTEC) 75 MG TBDP Take 1 tablet by mouth daily at 12 noon.     silodosin (RAPAFLO) 8 MG CAPS capsule Take 8 mg by mouth daily with breakfast.     tamsulosin (FLOMAX) 0.4 MG CAPS capsule Take 0.4 mg by mouth 2 (two) times daily. (Patient not taking: Reported on 01/19/2023)     No current facility-administered medications for this visit.    PHYSICAL EXAMINATION:  ECOG PERFORMANCE STATUS: 2 - Symptomatic, <50% confined to bed   There were no vitals filed for this visit.     There were no vitals filed for this visit.  Physical Exam Vitals and nursing note reviewed.  Constitutional:      Appearance: Normal appearance. He is not toxic-appearing or diaphoretic.     Comments: Thin.  Chronically ill appearing.  Temporal wasting  HENT:     Head: Normocephalic and  atraumatic.     Right Ear: External ear normal.     Left Ear: External ear normal.     Nose: Nose normal.  Eyes:     General: No scleral icterus.    Conjunctiva/sclera: Conjunctivae normal.     Pupils: Pupils are equal, round, and reactive to light.  Cardiovascular:     Rate and Rhythm: Regular rhythm. Tachycardia present.     Pulses: Normal pulses.     Heart sounds: Normal heart sounds.     No friction rub. No gallop.  Pulmonary:     Effort: Pulmonary effort is normal. No respiratory distress.     Breath sounds: Normal breath sounds. No stridor. No wheezing or rales.  Abdominal:     General: Abdomen is flat.     Tenderness: There is no abdominal tenderness. There is no guarding or rebound.  Genitourinary:    Comments: Has foley draining clear urine Musculoskeletal:        General: No swelling.     Cervical back: Normal range of motion and neck supple. No rigidity or tenderness.     Right lower leg: No edema.     Left lower leg: No edema.     Comments: Decreased muscle mass.  Kyphotic  Lymphadenopathy:     Head:     Right side of head: No submental, submandibular, tonsillar, preauricular, posterior auricular or occipital adenopathy.     Left side of head: No submental, submandibular, tonsillar, preauricular, posterior auricular or occipital adenopathy.     Cervical: No cervical adenopathy.     Right cervical: No superficial, deep or posterior cervical adenopathy.    Left cervical: No superficial, deep or posterior cervical adenopathy.     Upper Body:     Right upper body: No supraclavicular or axillary adenopathy.     Left upper body: No supraclavicular or axillary adenopathy.     Lower Body: No right inguinal adenopathy. No left inguinal adenopathy.  Skin:    Coloration: Skin is not jaundiced or pale.     Findings: Bruising present.  Neurological:     General: No focal deficit present.     Mental Status: He is alert and oriented to person, place, and time.     Comments:  Decreased hearing  Psychiatric:        Mood and Affect: Mood normal.        Behavior: Behavior normal.        Thought Content: Thought content normal.        Judgment: Judgment normal.     LABORATORY DATA: I have personally reviewed the data as listed:  No visits with results within 1 Month(s) from this visit.  Latest known visit with results is:  Appointment on 12/18/2022  Component Date Value Ref Range Status   Ferritin 12/18/2022 248  24 - 336 ng/mL Final   Performed at [facility], [address], [place], [place] [address]   Sodium 12/18/2022 141  135 - 145 mmol/L Final   Potassium 12/18/2022 4.2  3.5 - 5.1 mmol/L Final   Chloride 12/18/2022 107  98 - 111 mmol/L Final   CO2 12/18/2022 25  22 - 32 mmol/L Final   Glucose, Bld 12/18/2022 93  70 - 99 mg/dL Final   Glucose reference range applies only to samples taken after fasting for at least 8 hours.   BUN 12/18/2022 43 (H)  8 - 23 mg/dL Final   Creatinine, Ser 12/18/2022 1.87 (H)  0.61 - 1.24 mg/dL Final   Calcium 78/29/5621 9.2  8.9 - 10.3 mg/dL Final   Total Protein 30/86/5784 7.4  6.5 - 8.1 g/dL Final   Albumin 69/62/9528 4.0  3.5 - 5.0 g/dL Final   AST 41/32/4401 99 (H)  15 - 41 U/L Final   ALT 12/18/2022 140 (H)  0 - 44 U/L Final   Alkaline Phosphatase 12/18/2022 73  38 - 126 U/L Final   Total Bilirubin 12/18/2022 0.4  0.3 - 1.2 mg/dL Final   GFR, Estimated 12/18/2022 35 (L)  >60 mL/min Final   Comment: (NOTE) Calculated using the CKD-EPI Creatinine Equation (2021)    Anion gap 12/18/2022 9  5 - 15 Final   Performed at [facility], [address], [place], [place] [address]   WBC 12/18/2022 6.3  4.0 - 10.5 K/uL Final   RBC 12/18/2022 3.49 (L)  4.22 - 5.81 MIL/uL Final   Hemoglobin 12/18/2022 10.9 (L)  13.0 - 17.0 g/dL Final   HCT 36/64/4034 34.6 (L)  39.0 - 52.0 % Final   MCV 12/18/2022 99.1  80.0 - 100.0 fL Final   MCH 12/18/2022 31.2  26.0 - 34.0 pg Final   MCHC  12/18/2022 31.5  30.0 - 36.0 g/dL Final   RDW 74/25/9563 12.7  11.5 - 15.5 % Final   Platelets 12/18/2022 167  150 - 400 K/uL Final   nRBC 12/18/2022 0.0  0.0 - 0.2 % Final   Neutrophils Relative % 12/18/2022 62  % Final   Neutro Abs 12/18/2022 4.0  1.7 - 7.7 K/uL Final   Lymphocytes Relative 12/18/2022 26  % Final   Lymphs Abs 12/18/2022 1.6  0.7 - 4.0 K/uL Final   Monocytes Relative 12/18/2022 9  % Final   Monocytes Absolute 12/18/2022 0.6  0.1 - 1.0 K/uL Final   Eosinophils Relative 12/18/2022 2  % Final   Eosinophils Absolute 12/18/2022 0.1  0.0 - 0.5 K/uL Final   Basophils Relative 12/18/2022 1  % Final   Basophils Absolute 12/18/2022 0.0  0.0 - 0.1 K/uL Final   Immature Granulocytes 12/18/2022 0  % Final   Abs Immature Granulocytes 12/18/2022 0.01  0.00 - 0.07 K/uL Final   Performed at [facility], [address] [place], [place] [address]    RADIOGRAPHIC STUDIES: I have personally reviewed the radiological images as listed and agree with the findings in the report  No results found.  ASSESSMENT/PLAN  Anemia:  Multifactorial.  Main contributions are CKD (Cr Clearance 30) and low testosterone level April 12 2021:  Epo 20 000 units.   April 18 2021:  Following Hgb and ferritin levels.  May ultimately require IV iron as stores mobilized to make Hgb.  Holding on testosterone replacement due to thrombotic risk May 16 2022:  Hgb improved and ferritin adequate.  Will continue Epo.   Jul 17 2022- Hgb has trended downward due to recent ruling by CMS which precludes use unless Hgb < 10.0.  Will continue to follow Hgb Jul 19 2022- Erythropoietin 20,000 units  September 20 2022- Check CBC with diff monthly.  Only requiring Epo intermittently October 25 2022- Epo 20 000 November 20 2022- Hgb 11.0.  Cr 1.75.  Ferritin 159 November 23 2022:  Hgb is improved with use of ESA.  His degree of anemia does not account for fatigue.   December 18 2022:  Hgb 10.9 Ferritin 248  Cr 1.87  January 29 2023- Hgb 10.0 Ferritin 157.  Does not qualify for ESA currently because Hgb not less than 10.  Will give IV iron since ferritin has declined since last visit.  This may improve Hgb.   Hypothyroidism:  On replacement.  Can contribute to anemia  Bruising:  Can be due to skin fragility.   April 04 2022:   PLT count, PT/PTT are normal  Frail elderly:  Limits tolerance to instrumentation  September 20 2022- Discussed various causes of fatigue with patient and son- anemia, cardiac, CKD,  chronic pain.    Chronic kidney disease  Jul 17 2022- Discussed possible causes with patient and son, at their request.  Assured them the he does not meet criteria for requiring HD at this time  September 28 2022- Nephrology Consult.   Etiology is likely multifactorial in the setting of long-term NSAID use, along with HTN/microvascular disease, BPH/LUTS   December 18 2022- Cr 1.87 Will continue to follow    Cancer Staging  No matching staging information was found for the patient.    No problem-specific Assessment & Plan notes found for this encounter.   No orders of the defined types were placed in this encounter.  30  minutes was spent in patient care.  This included time spent preparing to see the patient (e.g., review of tests), obtaining and/or reviewing separately obtained history, counseling and educating the patient/family/caregiver, ordering medications, tests, or procedures; documenting clinical information in the electronic or other health record, independently interpreting results and communicating results to the patient/family/caregiver as well as coordination of care.       All questions were answered. The patient knows to call the clinic with any problems, questions or concerns.  This note was electronically signed.    Loni Muse, MD  01/29/2023 2:00 PM

## 2023-01-29 NOTE — Telephone Encounter (Signed)
01/29/23 Spoke with patient and confirmed next appt.

## 2023-01-31 ENCOUNTER — Encounter: Payer: Self-pay | Admitting: Oncology

## 2023-02-05 ENCOUNTER — Telehealth: Payer: Self-pay | Admitting: Oncology

## 2023-02-05 NOTE — Telephone Encounter (Signed)
Contacted pt to schedule an appt. Unable to reach via phone, voicemail was left.    Scheduling Message Entered by Domenic Schwab on 01/31/2023 at  3:55 PM Priority: Routine <No visit type provided>  Department: [facility]  Provider:  Appointment Notes:  Please schedule pt for 2 doses of feraheme

## 2023-02-08 NOTE — Telephone Encounter (Signed)
Contacted pt to schedule an appt. Unable to reach via phone, voicemail was left.

## 2023-02-12 ENCOUNTER — Ambulatory Visit: Payer: Medicare Other

## 2023-02-12 DIAGNOSIS — N401 Enlarged prostate with lower urinary tract symptoms: Secondary | ICD-10-CM | POA: Diagnosis not present

## 2023-02-12 DIAGNOSIS — N390 Urinary tract infection, site not specified: Secondary | ICD-10-CM | POA: Diagnosis not present

## 2023-02-12 DIAGNOSIS — N138 Other obstructive and reflux uropathy: Secondary | ICD-10-CM | POA: Diagnosis not present

## 2023-02-14 ENCOUNTER — Encounter: Payer: Self-pay | Admitting: Oncology

## 2023-02-15 ENCOUNTER — Inpatient Hospital Stay: Payer: Medicare Other | Attending: Oncology

## 2023-02-15 VITALS — BP 123/64 | HR 60 | Temp 98.0°F | Resp 16

## 2023-02-15 DIAGNOSIS — I129 Hypertensive chronic kidney disease with stage 1 through stage 4 chronic kidney disease, or unspecified chronic kidney disease: Secondary | ICD-10-CM | POA: Insufficient documentation

## 2023-02-15 DIAGNOSIS — D631 Anemia in chronic kidney disease: Secondary | ICD-10-CM | POA: Diagnosis not present

## 2023-02-15 DIAGNOSIS — Z87891 Personal history of nicotine dependence: Secondary | ICD-10-CM | POA: Insufficient documentation

## 2023-02-15 DIAGNOSIS — N183 Chronic kidney disease, stage 3 unspecified: Secondary | ICD-10-CM

## 2023-02-15 DIAGNOSIS — N189 Chronic kidney disease, unspecified: Secondary | ICD-10-CM | POA: Insufficient documentation

## 2023-02-15 MED ORDER — LORATADINE 10 MG PO TABS
10.0000 mg | ORAL_TABLET | Freq: Once | ORAL | Status: AC
Start: 2023-02-15 — End: 2023-02-15
  Administered 2023-02-15: 10 mg via ORAL
  Filled 2023-02-15: qty 1

## 2023-02-15 MED ORDER — SODIUM CHLORIDE 0.9 % IV SOLN
510.0000 mg | Freq: Once | INTRAVENOUS | Status: AC
  Administered 2023-02-15: 510 mg via INTRAVENOUS
  Filled 2023-02-15: qty 510

## 2023-02-15 MED ORDER — ACETAMINOPHEN 325 MG PO TABS
650.0000 mg | ORAL_TABLET | Freq: Once | ORAL | Status: AC
  Administered 2023-02-15: 650 mg via ORAL
  Filled 2023-02-15: qty 2

## 2023-02-15 MED ORDER — SODIUM CHLORIDE 0.9 % IV SOLN: INTRAVENOUS | Status: DC | PRN

## 2023-02-15 NOTE — Patient Instructions (Signed)
 Ferumoxytol Injection What is this medication? FERUMOXYTOL (FER ue MOX i tol) treats low levels of iron in your body (iron deficiency anemia). Iron is a mineral that plays an important role in making red blood cells, which carry oxygen from your lungs to the rest of your body. This medicine may be used for other purposes; ask your health care provider or pharmacist if you have questions. COMMON BRAND NAME(S): Feraheme What should I tell my care team before I take this medication? They need to know if you have any of these conditions: Anemia not caused by low iron levels High levels of iron in the blood Magnetic resonance imaging (MRI) test scheduled An unusual or allergic reaction to iron, other medications, foods, dyes, or preservatives Pregnant or trying to get pregnant Breastfeeding How should I use this medication? This medication is injected into a vein. It is given by your care team in a hospital or clinic setting. Talk to your care team the use of this medication in children. Special care may be needed. Overdosage: If you think you have taken too much of this medicine contact a poison control center or emergency room at once. NOTE: This medicine is only for you. Do not share this medicine with others. What if I miss a dose? It is important not to miss your dose. Call your care team if you are unable to keep an appointment. What may interact with this medication? Other iron products This list may not describe all possible interactions. Give your health care provider a list of all the medicines, herbs, non-prescription drugs, or dietary supplements you use. Also tell them if you smoke, drink alcohol, or use illegal drugs. Some items may interact with your medicine. What should I watch for while using this medication? Visit your care team regularly. Tell your care team if your symptoms do not start to get better or if they get worse. You may need blood work done while you are taking this  medication. You may need to follow a special diet. Talk to your care team. Foods that contain iron include: whole grains/cereals, dried fruits, beans, or peas, leafy green vegetables, and organ meats (liver, kidney). What side effects may I notice from receiving this medication? Side effects that you should report to your care team as soon as possible: Allergic reactions--skin rash, itching, hives, swelling of the face, lips, tongue, or throat Low blood pressure--dizziness, feeling faint or lightheaded, blurry vision Shortness of breath Side effects that usually do not require medical attention (report to your care team if they continue or are bothersome): Flushing Headache Joint pain Muscle pain Nausea Pain, redness, or irritation at injection site This list may not describe all possible side effects. Call your doctor for medical advice about side effects. You may report side effects to FDA at 1-800-FDA-1088. Where should I keep my medication? This medication is given in a hospital or clinic. It will not be stored at home. NOTE: This sheet is a summary. It may not cover all possible information. If you have questions about this medicine, talk to your doctor, pharmacist, or health care provider.  2024 Elsevier/Gold Standard (2022-07-28 00:00:00)

## 2023-02-19 ENCOUNTER — Ambulatory Visit: Payer: Medicare Other

## 2023-02-22 ENCOUNTER — Inpatient Hospital Stay: Payer: Medicare Other

## 2023-02-22 VITALS — BP 115/66 | HR 58 | Temp 97.8°F | Resp 18 | Ht 68.0 in

## 2023-02-22 DIAGNOSIS — Z87891 Personal history of nicotine dependence: Secondary | ICD-10-CM | POA: Diagnosis not present

## 2023-02-22 DIAGNOSIS — N183 Chronic kidney disease, stage 3 unspecified: Secondary | ICD-10-CM

## 2023-02-22 DIAGNOSIS — I129 Hypertensive chronic kidney disease with stage 1 through stage 4 chronic kidney disease, or unspecified chronic kidney disease: Secondary | ICD-10-CM | POA: Diagnosis not present

## 2023-02-22 DIAGNOSIS — D631 Anemia in chronic kidney disease: Secondary | ICD-10-CM | POA: Diagnosis not present

## 2023-02-22 DIAGNOSIS — N189 Chronic kidney disease, unspecified: Secondary | ICD-10-CM | POA: Diagnosis not present

## 2023-02-22 MED ORDER — SODIUM CHLORIDE 0.9 % IV SOLN
510.0000 mg | Freq: Once | INTRAVENOUS | Status: AC
  Administered 2023-02-22: 510 mg via INTRAVENOUS
  Filled 2023-02-22: qty 510

## 2023-02-22 MED ORDER — SODIUM CHLORIDE 0.9 % IV SOLN: INTRAVENOUS | Status: DC | PRN

## 2023-02-22 MED ORDER — LORATADINE 10 MG PO TABS
10.0000 mg | ORAL_TABLET | Freq: Once | ORAL | Status: AC
  Administered 2023-02-22: 10 mg via ORAL
  Filled 2023-02-22: qty 1

## 2023-02-22 MED ORDER — ACETAMINOPHEN 325 MG PO TABS
650.0000 mg | ORAL_TABLET | Freq: Once | ORAL | Status: AC
  Administered 2023-02-22: 650 mg via ORAL
  Filled 2023-02-22: qty 2

## 2023-02-22 NOTE — Patient Instructions (Signed)
 Iron-Rich Diet  Iron is a mineral that helps your body produce hemoglobin. Hemoglobin is a protein in red blood cells that carries oxygen to your body's tissues. Eating too little iron may cause you to feel weak and tired, and it can increase your risk of infection. Iron is naturally found in many foods, and many foods have iron added to them (are iron-fortified). You may need to follow an iron-rich diet if you do not have enough iron in your body due to certain medical conditions. The amount of iron that you need each day depends on your age, your sex, and any medical conditions you have. Follow instructions from your health care provider or a dietitian about how much iron you should eat each day. What are tips for following this plan? Reading food labels Check food labels to see how many milligrams (mg) of iron are in each serving. Cooking Cook foods in pots and pans that are made from iron. Take these steps to make it easier for your body to absorb iron from certain foods: Soak beans overnight before cooking. Soak whole grains overnight and drain them before using. Ferment flours before baking, such as by using yeast in bread dough. Meal planning When you eat foods that contain iron, you should eat them with foods that are high in vitamin C. These include oranges, peppers, tomatoes, potatoes, and mangoes. Vitamin C helps your body absorb iron. Certain foods and drinks prevent your body from absorbing iron properly. Avoid eating these foods in the same meal as iron-rich foods or with iron supplements. These foods include: Coffee, black tea, and red wine. Milk, dairy products, and foods that are high in calcium. Beans and soybeans. Whole grains. General information Take iron supplements only as told by your health care provider. An overdose of iron can be life-threatening. If you were prescribed iron supplements, take them with orange juice or a vitamin C supplement. When you eat  iron-fortified foods or take an iron supplement, you should also eat foods that naturally contain iron, such as meat, poultry, and fish. Eating naturally iron-rich foods helps your body absorb the iron that is added to other foods or contained in a supplement. Iron from animal sources is better absorbed than iron from plant sources. What foods should I eat? Fruits Prunes. Raisins. Eat fruits high in vitamin C, such as oranges, grapefruits, and strawberries, with iron-rich foods. Vegetables Spinach (cooked). Green peas. Broccoli. Fermented vegetables. Eat vegetables high in vitamin C, such as leafy greens, potatoes, bell peppers, and tomatoes, with iron-rich foods. Grains Iron-fortified breakfast cereal. Iron-fortified whole-wheat bread. Enriched rice. Sprouted grains. Meats and other proteins Beef liver. Beef. [place]. Chicken. Oysters. Shrimp. Tuna. Sardines. Chickpeas. Nuts. Tofu. Pumpkin seeds. Beverages Tomato juice. Fresh orange juice. Prune juice. Hibiscus tea. Iron-fortified instant breakfast shakes. Sweets and desserts Blackstrap molasses. Seasonings and condiments Tahini. Fermented soy sauce. Other foods Wheat germ. The items listed above may not be a complete list of recommended foods and beverages. Contact a dietitian for more information. What foods should I limit? These are foods that should be limited while eating iron-rich foods as they can reduce the absorption of iron in your body. Grains Whole grains. Bran cereal. Bran flour. Meats and other proteins Soybeans. Products made from soy protein. Black beans. Lentils. Mung beans. Split peas. Dairy Milk. Cream. Cheese. Yogurt. Cottage cheese. Beverages Coffee. Black tea. Red wine. Sweets and desserts Cocoa. Chocolate. Ice cream. Seasonings and condiments Basil. Oregano. Large amounts of parsley. The items listed  above may not be a complete list of foods and beverages you should limit. Contact a dietitian for more  information. Summary Iron is a mineral that helps your body produce hemoglobin. Hemoglobin is a protein in red blood cells that carries oxygen to your body's tissues. Iron is naturally found in many foods, and many foods have iron added to them (are iron-fortified). When you eat foods that contain iron, you should eat them with foods that are high in vitamin C. Vitamin C helps your body absorb iron. Certain foods and drinks prevent your body from absorbing iron properly, such as whole grains and dairy products. You should avoid eating these foods in the same meal as iron-rich foods or with iron supplements. This information is not intended to replace advice given to you by your health care provider. Make sure you discuss any questions you have with your health care provider. Document Revised: 02/02/2020 Document Reviewed: 02/02/2020 Elsevier Patient Education  2024 Elsevier Inc. Ferumoxytol Injection What is this medication? FERUMOXYTOL (FER ue MOX i tol) treats low levels of iron in your body (iron deficiency anemia). Iron is a mineral that plays an important role in making red blood cells, which carry oxygen from your lungs to the rest of your body. This medicine may be used for other purposes; ask your health care provider or pharmacist if you have questions. COMMON BRAND NAME(S): Feraheme What should I tell my care team before I take this medication? They need to know if you have any of these conditions: Anemia not caused by low iron levels High levels of iron in the blood Magnetic resonance imaging (MRI) test scheduled An unusual or allergic reaction to iron, other medications, foods, dyes, or preservatives Pregnant or trying to get pregnant Breastfeeding How should I use this medication? This medication is injected into a vein. It is given by your care team in a hospital or clinic setting. Talk to your care team the use of this medication in children. Special care may be  needed. Overdosage: If you think you have taken too much of this medicine contact a poison control center or emergency room at once. NOTE: This medicine is only for you. Do not share this medicine with others. What if I miss a dose? It is important not to miss your dose. Call your care team if you are unable to keep an appointment. What may interact with this medication? Other iron products This list may not describe all possible interactions. Give your health care provider a list of all the medicines, herbs, non-prescription drugs, or dietary supplements you use. Also tell them if you smoke, drink alcohol, or use illegal drugs. Some items may interact with your medicine. What should I watch for while using this medication? Visit your care team regularly. Tell your care team if your symptoms do not start to get better or if they get worse. You may need blood work done while you are taking this medication. You may need to follow a special diet. Talk to your care team. Foods that contain iron include: whole grains/cereals, dried fruits, beans, or peas, leafy green vegetables, and organ meats (liver, kidney). What side effects may I notice from receiving this medication? Side effects that you should report to your care team as soon as possible: Allergic reactions--skin rash, itching, hives, swelling of the face, lips, tongue, or throat Low blood pressure--dizziness, feeling faint or lightheaded, blurry vision Shortness of breath Side effects that usually do not require medical attention (report to  your care team if they continue or are bothersome): Flushing Headache Joint pain Muscle pain Nausea Pain, redness, or irritation at injection site This list may not describe all possible side effects. Call your doctor for medical advice about side effects. You may report side effects to FDA at 1-800-FDA-1088. Where should I keep my medication? This medication is given in a hospital or clinic. It will  not be stored at home. NOTE: This sheet is a summary. It may not cover all possible information. If you have questions about this medicine, talk to your doctor, pharmacist, or health care provider.  2024 Elsevier/Gold Standard (2022-07-28 00:00:00)

## 2023-03-12 DIAGNOSIS — Z6821 Body mass index (BMI) 21.0-21.9, adult: Secondary | ICD-10-CM | POA: Diagnosis not present

## 2023-03-12 DIAGNOSIS — G40509 Epileptic seizures related to external causes, not intractable, without status epilepticus: Secondary | ICD-10-CM | POA: Diagnosis not present

## 2023-03-12 DIAGNOSIS — R519 Headache, unspecified: Secondary | ICD-10-CM | POA: Diagnosis not present

## 2023-03-12 DIAGNOSIS — E039 Hypothyroidism, unspecified: Secondary | ICD-10-CM | POA: Diagnosis not present

## 2023-03-12 DIAGNOSIS — E785 Hyperlipidemia, unspecified: Secondary | ICD-10-CM | POA: Diagnosis not present

## 2023-03-12 DIAGNOSIS — I5030 Unspecified diastolic (congestive) heart failure: Secondary | ICD-10-CM | POA: Diagnosis not present

## 2023-03-12 DIAGNOSIS — D509 Iron deficiency anemia, unspecified: Secondary | ICD-10-CM | POA: Diagnosis not present

## 2023-03-12 DIAGNOSIS — N138 Other obstructive and reflux uropathy: Secondary | ICD-10-CM | POA: Diagnosis not present

## 2023-03-12 DIAGNOSIS — I2510 Atherosclerotic heart disease of native coronary artery without angina pectoris: Secondary | ICD-10-CM | POA: Diagnosis not present

## 2023-03-19 DIAGNOSIS — Z01810 Encounter for preprocedural cardiovascular examination: Secondary | ICD-10-CM | POA: Diagnosis not present

## 2023-03-19 DIAGNOSIS — R339 Retention of urine, unspecified: Secondary | ICD-10-CM | POA: Diagnosis not present

## 2023-03-19 DIAGNOSIS — D494 Neoplasm of unspecified behavior of bladder: Secondary | ICD-10-CM | POA: Diagnosis not present

## 2023-03-19 DIAGNOSIS — N401 Enlarged prostate with lower urinary tract symptoms: Secondary | ICD-10-CM | POA: Diagnosis not present

## 2023-03-19 DIAGNOSIS — N138 Other obstructive and reflux uropathy: Secondary | ICD-10-CM | POA: Diagnosis not present

## 2023-03-19 DIAGNOSIS — R32 Unspecified urinary incontinence: Secondary | ICD-10-CM | POA: Diagnosis not present

## 2023-03-20 DIAGNOSIS — D499 Neoplasm of unspecified behavior of unspecified site: Secondary | ICD-10-CM | POA: Diagnosis not present

## 2023-03-20 DIAGNOSIS — N401 Enlarged prostate with lower urinary tract symptoms: Secondary | ICD-10-CM | POA: Diagnosis not present

## 2023-04-02 ENCOUNTER — Inpatient Hospital Stay: Payer: Medicare Other

## 2023-04-02 ENCOUNTER — Ambulatory Visit: Payer: Medicare Other | Admitting: Oncology

## 2023-04-04 ENCOUNTER — Encounter: Payer: Self-pay | Admitting: Hematology and Oncology

## 2023-04-04 ENCOUNTER — Inpatient Hospital Stay: Payer: Medicare Other | Attending: Oncology | Admitting: Hematology and Oncology

## 2023-04-04 ENCOUNTER — Other Ambulatory Visit: Payer: Self-pay | Admitting: Hematology and Oncology

## 2023-04-04 ENCOUNTER — Inpatient Hospital Stay: Payer: Medicare Other

## 2023-04-04 VITALS — BP 120/67 | HR 65 | Temp 98.7°F | Resp 18 | Ht 68.0 in | Wt 129.0 lb

## 2023-04-04 DIAGNOSIS — N189 Chronic kidney disease, unspecified: Secondary | ICD-10-CM | POA: Insufficient documentation

## 2023-04-04 DIAGNOSIS — Z79899 Other long term (current) drug therapy: Secondary | ICD-10-CM | POA: Diagnosis not present

## 2023-04-04 DIAGNOSIS — N1832 Chronic kidney disease, stage 3b: Secondary | ICD-10-CM

## 2023-04-04 DIAGNOSIS — D631 Anemia in chronic kidney disease: Secondary | ICD-10-CM | POA: Diagnosis not present

## 2023-04-04 DIAGNOSIS — D539 Nutritional anemia, unspecified: Secondary | ICD-10-CM

## 2023-04-04 DIAGNOSIS — D509 Iron deficiency anemia, unspecified: Secondary | ICD-10-CM | POA: Diagnosis not present

## 2023-04-04 LAB — CMP (CANCER CENTER ONLY)
ALT: 19 U/L (ref 0–44)
AST: 24 U/L (ref 15–41)
Albumin: 4.1 g/dL (ref 3.5–5.0)
Alkaline Phosphatase: 138 U/L — ABNORMAL HIGH (ref 38–126)
Anion gap: 12 (ref 5–15)
BUN: 27 mg/dL — ABNORMAL HIGH (ref 8–23)
CO2: 24 mmol/L (ref 22–32)
Calcium: 9.6 mg/dL (ref 8.9–10.3)
Chloride: 106 mmol/L (ref 98–111)
Creatinine: 1.58 mg/dL — ABNORMAL HIGH (ref 0.61–1.24)
GFR, Estimated: 43 mL/min — ABNORMAL LOW (ref >60)
Glucose, Bld: 100 mg/dL — ABNORMAL HIGH (ref 70–99)
Potassium: 4.5 mmol/L (ref 3.5–5.1)
Sodium: 142 mmol/L (ref 135–145)
Total Bilirubin: 0.3 mg/dL (ref 0.0–1.2)
Total Protein: 6.9 g/dL (ref 6.5–8.1)

## 2023-04-04 LAB — CBC WITH DIFFERENTIAL (CANCER CENTER ONLY)
Abs Immature Granulocytes: 0.01 10*3/uL (ref 0.00–0.07)
Basophils Absolute: 0 10*3/uL (ref 0.0–0.1)
Basophils Relative: 0 %
Eosinophils Absolute: 0.1 10*3/uL (ref 0.0–0.5)
Eosinophils Relative: 2 %
HCT: 30.7 % — ABNORMAL LOW (ref 39.0–52.0)
Hemoglobin: 10.5 g/dL — ABNORMAL LOW (ref 13.0–17.0)
Immature Granulocytes: 0 %
Lymphocytes Relative: 24 %
Lymphs Abs: 1.4 10*3/uL (ref 0.7–4.0)
MCH: 31.4 pg (ref 26.0–34.0)
MCHC: 34.2 g/dL (ref 30.0–36.0)
MCV: 91.9 fL (ref 80.0–100.0)
Monocytes Absolute: 0.5 10*3/uL (ref 0.1–1.0)
Monocytes Relative: 8 %
Neutro Abs: 3.7 10*3/uL (ref 1.7–7.7)
Neutrophils Relative %: 66 %
Platelet Count: 147 10*3/uL — ABNORMAL LOW (ref 150–400)
RBC: 3.34 MIL/uL — ABNORMAL LOW (ref 4.22–5.81)
RDW: 12.9 % (ref 11.5–15.5)
WBC Count: 5.7 10*3/uL (ref 4.0–10.5)
nRBC: 0 % (ref 0.0–0.2)
nRBC: 0 /100{WBCs}

## 2023-04-04 LAB — IRON AND TIBC
Iron: 76 ug/dL (ref 45–182)
Saturation Ratios: 35 % (ref 17.9–39.5)
TIBC: 218 ug/dL — ABNORMAL LOW (ref 250–450)
UIBC: 142 ug/dL

## 2023-04-04 LAB — VITAMIN B12: Vitamin B-12: 324 pg/mL (ref 180–914)

## 2023-04-04 LAB — FERRITIN: Ferritin: 359 ng/mL — ABNORMAL HIGH (ref 24–336)

## 2023-04-04 LAB — FOLATE: Folate: 24 ng/mL (ref >5.9)

## 2023-04-04 NOTE — Progress Notes (Cosign Needed)
[facility]  [address] [place],  [place]  [address] [phone]  Clinic Day:  04/04/2023  Referring physician: [name], MD   HISTORY OF PRESENT ILLNESS:  The patient is a 83 y.o. male with a history of iron deficiency anemia, as well as anemia of chronic kidney disease who we began seeing in January 2024. He had previously received IV iron at another center.  He receives epoetin monthly as needed to keep his hemoglobin at or above 10. He received IV iron in the form of Feraheme in December 2024. He is here today for repeat clinical assessment and states he is feeling better since receiving Feraheme. He denies any overt form of blood loss. 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 His son accompanies him today and states he has a lesion in the bladder as well. He is scheduled for transurethral resection of the prostate and bladder lesion January 31.  PHYSICAL EXAM:  Blood pressure 120/67, pulse 65, temperature 98.7 F (37.1 C), temperature source Oral, resp. rate 18, height 5\' 8"  (1.727 m), weight 129 lb (58.5 kg), SpO2 99%. Wt Readings from Last 3 Encounters:  04/04/23 129 lb (58.5 kg)  01/29/23 132 lb 6.4 oz (60.1 kg)  01/19/23 130 lb 9.6 oz (59.2 kg)   Body mass index is 19.61 kg/m.  Performance status (ECOG): 1 - Symptomatic but completely ambulatory  Physical Exam Vitals and nursing note reviewed.  Constitutional:      General: He is not in acute distress.    Appearance: Normal appearance. He is normal weight.  HENT:     Head: Normocephalic and atraumatic.     Mouth/Throat:     Mouth: Mucous membranes are moist.     Pharynx: Oropharynx is clear. No oropharyngeal exudate or posterior oropharyngeal erythema.  Eyes:     General: No scleral icterus.    Extraocular Movements: Extraocular movements intact.     Conjunctiva/sclera: Conjunctivae normal.     Pupils: Pupils are equal, round, and reactive to light.  Cardiovascular:     Rate and  Rhythm: Normal rate and regular rhythm. Frequent Extrasystoles (every 3 beats) are present.    Heart sounds: Normal heart sounds. No murmur heard.    No friction rub. No gallop.  Pulmonary:     Effort: Pulmonary effort is normal.     Breath sounds: Normal breath sounds. No wheezing, rhonchi or rales.  Abdominal:     General: Bowel sounds are normal. There is no distension.     Palpations: Abdomen is soft. There is no hepatomegaly, splenomegaly or mass.     Tenderness: There is no abdominal tenderness.  Musculoskeletal:        General: Normal range of motion.     Cervical back: Normal range of motion and neck supple. No tenderness.     Right lower leg: No edema.     Left lower leg: No edema.  Lymphadenopathy:     Cervical: No cervical adenopathy.     Upper Body:     Right upper body: No supraclavicular or axillary adenopathy.     Left upper body: No supraclavicular or axillary adenopathy.     Lower Body: No right inguinal adenopathy. No left inguinal adenopathy.  Skin:    General: Skin is warm and dry.     Coloration: Skin is not jaundiced.     Findings: No rash.  Neurological:     Mental Status: He is alert and oriented to person, place, and  time.     Cranial Nerves: No cranial nerve deficit.  Psychiatric:        Mood and Affect: Mood normal.        Behavior: Behavior normal.        Thought Content: Thought content normal.     LABS:      Latest Ref Rng & Units 04/04/2023    2:47 PM 01/29/2023    2:28 PM 12/18/2022    1:48 PM  CBC  WBC 4.0 - 10.5 K/uL 5.7  6.0  6.3   Hemoglobin 13.0 - 17.0 g/dL 95.6  21.3  08.6   Hematocrit 39.0 - 52.0 % 30.7  29.8  34.6   Platelets 150 - 400 K/uL 147  138  167       Latest Ref Rng & Units 04/04/2023    2:47 PM 01/29/2023    2:27 PM 12/18/2022    1:48 PM  CMP  Glucose 70 - 99 mg/dL 578  92  93   BUN 8 - 23 mg/dL 27  32  43   Creatinine 0.61 - 1.24 mg/dL 4.69  6.29  5.28   Sodium 135 - 145 mmol/L 142  142  141   Potassium 3.5 -  5.1 mmol/L 4.5  4.0  4.2   Chloride 98 - 111 mmol/L 106  110  107   CO2 22 - 32 mmol/L 24  20  25    Calcium 8.9 - 10.3 mg/dL 9.6  9.8  9.2   Total Protein 6.5 - 8.1 g/dL 6.9   7.4   Total Bilirubin 0.0 - 1.2 mg/dL 0.3   0.4   Alkaline Phos 38 - 126 U/L 138   73   AST 15 - 41 U/L 24   99   ALT 0 - 44 U/L 19   140      Lab Results  Component Value Date   TOTALPROTELP 6.9 04/04/2022   Lab Results  Component Value Date   FERRITIN 157 01/29/2023   FERRITIN 248 12/18/2022   FERRITIN 159 11/20/2022       Component Value Date/Time   TOTALPROTELP 6.9 04/04/2022 1517   IGGSERUM 1,159 04/04/2022 1517   IGMSERUM 195 (H) 04/04/2022 1517    Review Flowsheet  More data exists      Latest Ref Rng & Units 11/20/2022 12/18/2022 01/29/2023  Oncology Labs  Ferritin 24 - 336 ng/mL 159  248  157      STUDIES:  No results found.    ASSESSMENT & PLAN:   Assessment/Plan:  83 y.o. male with multifactorial anemia due to iron deficiency and anemia of chronic kidney disease. His hemoglobin improved, but did not normalize with IV iron. Iron studies are pending from today.  As his hemoglobin remains above 10, he is not eligible for epoetin. He is scheduled for surgery this week. I will plan to follow up on the pathology.  I will plan to monitor him closely and see him back in 4 weeks. The patient and his son understand all the plans discussed today and are in agreement with them.  He knows to contact our office if he develops concerns prior to his next appointment.     Adah Perl, PA-C   Physician Assistant [facility] [phone]

## 2023-04-05 ENCOUNTER — Telehealth: Payer: Self-pay

## 2023-04-05 NOTE — Telephone Encounter (Signed)
-----   Message from Adah Perl sent at 04/05/2023 12:14 PM EST ----- Please let his son know vitamin levels were normal. Thanks

## 2023-04-05 NOTE — Telephone Encounter (Signed)
Detailed message left for patients son Alecia Lemming.

## 2023-04-06 ENCOUNTER — Encounter: Payer: Self-pay | Admitting: Oncology

## 2023-04-06 DIAGNOSIS — N400 Enlarged prostate without lower urinary tract symptoms: Secondary | ICD-10-CM | POA: Diagnosis not present

## 2023-04-06 DIAGNOSIS — C679 Malignant neoplasm of bladder, unspecified: Secondary | ICD-10-CM | POA: Diagnosis not present

## 2023-04-06 DIAGNOSIS — Z87891 Personal history of nicotine dependence: Secondary | ICD-10-CM | POA: Diagnosis not present

## 2023-04-06 DIAGNOSIS — R32 Unspecified urinary incontinence: Secondary | ICD-10-CM | POA: Diagnosis not present

## 2023-04-06 DIAGNOSIS — E785 Hyperlipidemia, unspecified: Secondary | ICD-10-CM | POA: Diagnosis not present

## 2023-04-06 DIAGNOSIS — R3916 Straining to void: Secondary | ICD-10-CM | POA: Diagnosis not present

## 2023-04-06 DIAGNOSIS — N401 Enlarged prostate with lower urinary tract symptoms: Secondary | ICD-10-CM | POA: Diagnosis not present

## 2023-04-06 DIAGNOSIS — C674 Malignant neoplasm of posterior wall of bladder: Secondary | ICD-10-CM | POA: Diagnosis not present

## 2023-04-06 DIAGNOSIS — Z79899 Other long term (current) drug therapy: Secondary | ICD-10-CM | POA: Diagnosis not present

## 2023-04-06 DIAGNOSIS — R339 Retention of urine, unspecified: Secondary | ICD-10-CM | POA: Diagnosis not present

## 2023-04-06 DIAGNOSIS — N189 Chronic kidney disease, unspecified: Secondary | ICD-10-CM | POA: Diagnosis not present

## 2023-04-06 DIAGNOSIS — D303 Benign neoplasm of bladder: Secondary | ICD-10-CM | POA: Diagnosis not present

## 2023-04-06 DIAGNOSIS — R338 Other retention of urine: Secondary | ICD-10-CM | POA: Diagnosis not present

## 2023-04-06 DIAGNOSIS — E039 Hypothyroidism, unspecified: Secondary | ICD-10-CM | POA: Diagnosis not present

## 2023-04-06 DIAGNOSIS — I129 Hypertensive chronic kidney disease with stage 1 through stage 4 chronic kidney disease, or unspecified chronic kidney disease: Secondary | ICD-10-CM | POA: Diagnosis not present

## 2023-04-06 DIAGNOSIS — N3080 Other cystitis without hematuria: Secondary | ICD-10-CM | POA: Diagnosis not present

## 2023-04-06 DIAGNOSIS — R3912 Poor urinary stream: Secondary | ICD-10-CM | POA: Diagnosis not present

## 2023-04-06 DIAGNOSIS — R3914 Feeling of incomplete bladder emptying: Secondary | ICD-10-CM | POA: Diagnosis not present

## 2023-04-10 DIAGNOSIS — R339 Retention of urine, unspecified: Secondary | ICD-10-CM | POA: Diagnosis not present

## 2023-04-10 DIAGNOSIS — N138 Other obstructive and reflux uropathy: Secondary | ICD-10-CM | POA: Diagnosis not present

## 2023-04-10 DIAGNOSIS — N401 Enlarged prostate with lower urinary tract symptoms: Secondary | ICD-10-CM | POA: Diagnosis not present

## 2023-04-17 DIAGNOSIS — N138 Other obstructive and reflux uropathy: Secondary | ICD-10-CM | POA: Diagnosis not present

## 2023-04-17 DIAGNOSIS — N401 Enlarged prostate with lower urinary tract symptoms: Secondary | ICD-10-CM | POA: Diagnosis not present

## 2023-05-02 ENCOUNTER — Ambulatory Visit: Payer: Medicare Other | Admitting: Hematology and Oncology

## 2023-05-02 ENCOUNTER — Other Ambulatory Visit: Payer: Medicare Other

## 2023-05-02 DIAGNOSIS — F5232 Male orgasmic disorder: Secondary | ICD-10-CM | POA: Diagnosis not present

## 2023-05-02 DIAGNOSIS — N138 Other obstructive and reflux uropathy: Secondary | ICD-10-CM | POA: Diagnosis not present

## 2023-05-02 DIAGNOSIS — N401 Enlarged prostate with lower urinary tract symptoms: Secondary | ICD-10-CM | POA: Diagnosis not present

## 2023-05-02 DIAGNOSIS — R339 Retention of urine, unspecified: Secondary | ICD-10-CM | POA: Diagnosis not present

## 2023-05-03 ENCOUNTER — Other Ambulatory Visit: Payer: Self-pay

## 2023-05-03 ENCOUNTER — Inpatient Hospital Stay (HOSPITAL_BASED_OUTPATIENT_CLINIC_OR_DEPARTMENT_OTHER): Payer: Medicare Other | Admitting: Hematology and Oncology

## 2023-05-03 ENCOUNTER — Inpatient Hospital Stay: Payer: Medicare Other | Attending: Oncology

## 2023-05-03 VITALS — BP 162/73 | HR 66 | Temp 98.3°F | Resp 12 | Ht 68.0 in | Wt 131.5 lb

## 2023-05-03 DIAGNOSIS — N1832 Chronic kidney disease, stage 3b: Secondary | ICD-10-CM

## 2023-05-03 DIAGNOSIS — N189 Chronic kidney disease, unspecified: Secondary | ICD-10-CM | POA: Insufficient documentation

## 2023-05-03 DIAGNOSIS — D631 Anemia in chronic kidney disease: Secondary | ICD-10-CM | POA: Insufficient documentation

## 2023-05-03 LAB — CMP (CANCER CENTER ONLY)
ALT: 28 U/L (ref 0–44)
AST: 27 U/L (ref 15–41)
Albumin: 4.1 g/dL (ref 3.5–5.0)
Alkaline Phosphatase: 118 U/L (ref 38–126)
Anion gap: 13 (ref 5–15)
BUN: 33 mg/dL — ABNORMAL HIGH (ref 8–23)
CO2: 26 mmol/L (ref 22–32)
Calcium: 9.6 mg/dL (ref 8.9–10.3)
Chloride: 104 mmol/L (ref 98–111)
Creatinine: 1.62 mg/dL — ABNORMAL HIGH (ref 0.61–1.24)
GFR, Estimated: 42 mL/min — ABNORMAL LOW (ref >60)
Glucose, Bld: 103 mg/dL — ABNORMAL HIGH (ref 70–99)
Potassium: 4.6 mmol/L (ref 3.5–5.1)
Sodium: 143 mmol/L (ref 135–145)
Total Bilirubin: <0.2 mg/dL (ref 0.0–1.2)
Total Protein: 6.7 g/dL (ref 6.5–8.1)

## 2023-05-03 LAB — IRON AND TIBC
Iron: 102 ug/dL (ref 45–182)
Saturation Ratios: 39 % (ref 17.9–39.5)
TIBC: 265 ug/dL (ref 250–450)
UIBC: 163 ug/dL

## 2023-05-03 LAB — CBC WITH DIFFERENTIAL (CANCER CENTER ONLY)
Abs Immature Granulocytes: 0.02 10*3/uL (ref 0.00–0.07)
Basophils Absolute: 0 10*3/uL (ref 0.0–0.1)
Basophils Relative: 0 %
Eosinophils Absolute: 0.1 10*3/uL (ref 0.0–0.5)
Eosinophils Relative: 2 %
HCT: 31.2 % — ABNORMAL LOW (ref 39.0–52.0)
Hemoglobin: 10.5 g/dL — ABNORMAL LOW (ref 13.0–17.0)
Immature Granulocytes: 0 %
Immature Platelet Fraction: 2.6 % (ref 1.2–8.6)
Lymphocytes Relative: 25 %
Lymphs Abs: 1.5 10*3/uL (ref 0.7–4.0)
MCH: 31.2 pg (ref 26.0–34.0)
MCHC: 33.7 g/dL (ref 30.0–36.0)
MCV: 92.6 fL (ref 80.0–100.0)
Monocytes Absolute: 0.7 10*3/uL (ref 0.1–1.0)
Monocytes Relative: 11 %
Neutro Abs: 3.6 10*3/uL (ref 1.7–7.7)
Neutrophils Relative %: 62 %
Platelet Count: 156 10*3/uL (ref 150–400)
RBC: 3.37 MIL/uL — ABNORMAL LOW (ref 4.22–5.81)
RDW: 12.7 % (ref 11.5–15.5)
WBC Count: 5.9 10*3/uL (ref 4.0–10.5)
nRBC: 0 % (ref 0.0–0.2)
nRBC: 0 /100{WBCs}

## 2023-05-03 LAB — FERRITIN: Ferritin: 369 ng/mL — ABNORMAL HIGH (ref 24–336)

## 2023-05-03 NOTE — Progress Notes (Unsigned)
[facility]  [address] [place],  [place]  [address] [phone]  Clinic Day:  05/03/2023  Referring physician: [name], MD   HISTORY OF PRESENT ILLNESS:  The patient is a 83 y.o. male with a history of iron deficiency anemia, as well as anemia of chronic kidney disease who we began seeing in January 2024. He had previously received IV iron at another center.  He receives epoetin monthly as needed to keep his hemoglobin at or above 10. He received IV iron in the form of Feraheme again in December 2024 with slight improvement in his hemoglobin. He is here today for repeat clinical assessment and states he is feeling better since receiving Feraheme. He denies any overt form of blood loss. His son accompanies him today and states the patient seemed to have less energy this week, his appetite has been decreased, and he has had episodes of nausea without vomiting.  Of note, the patient underwent transurethral resection of the prostate and bladder lesion on January 31. Pathology from this procedure was negative for malignancy.   PHYSICAL EXAM:  Blood pressure (!) 162/73, pulse 66, temperature 98.3 F (36.8 C), temperature source Oral, resp. rate 12, height 5\' 8"  (1.727 m), weight 131 lb 8 oz (59.6 kg), SpO2 100%. Wt Readings from Last 3 Encounters:  05/03/23 131 lb 8 oz (59.6 kg)  04/04/23 129 lb (58.5 kg)  01/29/23 132 lb 6.4 oz (60.1 kg)   Body mass index is 19.99 kg/m.  Performance status (ECOG): 2 - Symptomatic, <50% confined to bed  Physical Exam Vitals and nursing note reviewed.  Constitutional:      General: He is not in acute distress.    Appearance: Normal appearance. He is normal weight.  HENT:     Head: Normocephalic and atraumatic.     Mouth/Throat:     Mouth: Mucous membranes are moist.     Pharynx: Oropharynx is clear. No oropharyngeal exudate or posterior oropharyngeal erythema.  Eyes:     General: No scleral icterus.    Extraocular  Movements: Extraocular movements intact.     Conjunctiva/sclera: Conjunctivae normal.     Pupils: Pupils are equal, round, and reactive to light.  Cardiovascular:     Rate and Rhythm: Normal rate and regular rhythm.     Heart sounds: Normal heart sounds. No murmur heard.    No friction rub. No gallop.  Pulmonary:     Effort: Pulmonary effort is normal.     Breath sounds: Normal breath sounds. No wheezing, rhonchi or rales.  Abdominal:     General: Bowel sounds are normal. There is no distension.     Palpations: Abdomen is soft. There is no hepatomegaly, splenomegaly or mass.     Tenderness: There is no abdominal tenderness.  Musculoskeletal:        General: Normal range of motion.     Cervical back: Normal range of motion and neck supple. No tenderness.     Right lower leg: No edema.     Left lower leg: No edema.  Lymphadenopathy:     Cervical: No cervical adenopathy.     Upper Body:     Right upper body: No supraclavicular or axillary adenopathy.     Left upper body: No supraclavicular or axillary adenopathy.     Lower Body: No right inguinal adenopathy. No left inguinal adenopathy.  Skin:    General: Skin is warm and dry.     Coloration: Skin is not jaundiced.  Findings: No rash.  Neurological:     Mental Status: He is alert and oriented to person, place, and time.     Cranial Nerves: No cranial nerve deficit.  Psychiatric:        Mood and Affect: Mood normal.        Behavior: Behavior normal.        Thought Content: Thought content normal.    LABS:      Latest Ref Rng & Units 05/03/2023    2:23 PM 04/04/2023    2:47 PM 01/29/2023    2:28 PM  CBC  WBC 4.0 - 10.5 K/uL 5.9  5.7  6.0   Hemoglobin 13.0 - 17.0 g/dL 82.9  56.2  13.0   Hematocrit 39.0 - 52.0 % 31.2  30.7  29.8   Platelets 150 - 400 K/uL 156  147  138       Latest Ref Rng & Units 05/03/2023    2:23 PM 04/04/2023    2:47 PM 01/29/2023    2:27 PM  CMP  Glucose 70 - 99 mg/dL 865  784  92   BUN 8 - 23  mg/dL 33  27  32   Creatinine 0.61 - 1.24 mg/dL 6.96  2.95  2.84   Sodium 135 - 145 mmol/L 143  142  142   Potassium 3.5 - 5.1 mmol/L 4.6  4.5  4.0   Chloride 98 - 111 mmol/L 104  106  110   CO2 22 - 32 mmol/L 26  24  20    Calcium 8.9 - 10.3 mg/dL 9.6  9.6  9.8   Total Protein 6.5 - 8.1 g/dL 6.7  6.9    Total Bilirubin 0.0 - 1.2 mg/dL <1.3  0.3    Alkaline Phos 38 - 126 U/L 118  138    AST 15 - 41 U/L 27  24    ALT 0 - 44 U/L 28  19      Lab Results  Component Value Date   TOTALPROTELP 6.9 04/04/2022   Lab Results  Component Value Date   TIBC 218 (L) 04/04/2023   FERRITIN 359 (H) 04/04/2023   FERRITIN 157 01/29/2023   FERRITIN 248 12/18/2022   IRONPCTSAT 35 04/04/2023   No results found for: "LDH"     Component Value Date/Time   TOTALPROTELP 6.9 04/04/2022 1517   IGGSERUM 1,159 04/04/2022 1517   IGMSERUM 195 (H) 04/04/2022 1517    Review Flowsheet  More data exists      Latest Ref Rng & Units 12/18/2022 01/29/2023 04/04/2023  Oncology Labs  Ferritin 24 - 336 ng/mL 248  157  359   %SAT 17.9 - 39.5 % - - 35      STUDIES:  No results found.    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 His hemoglobin is above 10, so epoetin will need to be held.  Iron studies, B12 and folate are pending from today.  Per his son, he has not been feeling as well this week. If this continues, I asked him to see Dr. Sudie Bailey.  I will plan to see him back in 3 months for repeat clinical assessment.  The patient understands all the plans discussed today and is in agreement with them.  He knows to contact our office if he develops concerns prior to his next appointment.     Adah Perl, PA-C   Physician Assistant [facility] [place] (  [phone]    

## 2023-05-04 ENCOUNTER — Encounter: Payer: Self-pay | Admitting: Hematology and Oncology

## 2023-05-04 ENCOUNTER — Telehealth: Payer: Self-pay

## 2023-05-04 NOTE — Telephone Encounter (Signed)
 Detailed message left on home phone and patients sons cell phone.

## 2023-05-04 NOTE — Telephone Encounter (Signed)
-----   Message from Adah Perl sent at 05/04/2023  3:54 PM EST ----- Please let his son know that his iron stores, B12 and folate are good. Keep appt in 3 months.

## 2023-05-17 ENCOUNTER — Telehealth: Payer: Self-pay

## 2023-05-17 NOTE — Telephone Encounter (Signed)
 Message left from patients son. Patient is stressed out and weak I tried to contact the son no answer I left  him a detailed message we needed more info. If he felt like he needed more immediate care to go to ED. Normal labs reported on 05/04/2023. Also left message for the patient to call us back , so we could obtain more info, if needing urgent care to go to the ED.

## 2023-05-25 DIAGNOSIS — D649 Anemia, unspecified: Secondary | ICD-10-CM | POA: Diagnosis not present

## 2023-05-25 DIAGNOSIS — N1831 Chronic kidney disease, stage 3a: Secondary | ICD-10-CM | POA: Diagnosis not present

## 2023-06-08 DIAGNOSIS — G43909 Migraine, unspecified, not intractable, without status migrainosus: Secondary | ICD-10-CM | POA: Diagnosis not present

## 2023-06-08 DIAGNOSIS — M1990 Unspecified osteoarthritis, unspecified site: Secondary | ICD-10-CM | POA: Diagnosis not present

## 2023-06-08 DIAGNOSIS — R636 Underweight: Secondary | ICD-10-CM | POA: Diagnosis not present

## 2023-06-08 DIAGNOSIS — Z719 Counseling, unspecified: Secondary | ICD-10-CM | POA: Diagnosis not present

## 2023-07-10 DIAGNOSIS — E039 Hypothyroidism, unspecified: Secondary | ICD-10-CM | POA: Diagnosis not present

## 2023-07-10 DIAGNOSIS — I2510 Atherosclerotic heart disease of native coronary artery without angina pectoris: Secondary | ICD-10-CM | POA: Diagnosis not present

## 2023-07-10 DIAGNOSIS — R42 Dizziness and giddiness: Secondary | ICD-10-CM | POA: Diagnosis not present

## 2023-07-10 DIAGNOSIS — E559 Vitamin D deficiency, unspecified: Secondary | ICD-10-CM | POA: Diagnosis not present

## 2023-07-10 DIAGNOSIS — Z6821 Body mass index (BMI) 21.0-21.9, adult: Secondary | ICD-10-CM | POA: Diagnosis not present

## 2023-07-10 DIAGNOSIS — Z79899 Other long term (current) drug therapy: Secondary | ICD-10-CM | POA: Diagnosis not present

## 2023-07-10 DIAGNOSIS — D509 Iron deficiency anemia, unspecified: Secondary | ICD-10-CM | POA: Diagnosis not present

## 2023-07-10 DIAGNOSIS — R519 Headache, unspecified: Secondary | ICD-10-CM | POA: Diagnosis not present

## 2023-07-10 DIAGNOSIS — E785 Hyperlipidemia, unspecified: Secondary | ICD-10-CM | POA: Diagnosis not present

## 2023-07-27 NOTE — Progress Notes (Unsigned)
 [facility]  [address] [place],  [place]  [address] [phone]  Clinic Day:  07/31/2023  Referring physician: Olan Bering, MD   HISTORY OF PRESENT ILLNESS:  The patient is a 83 y.o. 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 He had previously received IV iron at another center.  He receives epoetin  monthly as needed to keep his hemoglobin at or above 10. He received IV iron in the form of Feraheme again in December 2024 with slight improvement in his hemoglobin. He is here today for repeat clinical assessment.  He denies progressive fatigue concerning for worsening anemia.  He denies any overt form of blood loss.  His son accompanies him today and states he has had more trouble walking.  Apparently his Keppra level is elevated.  He has been decreasing his father's Keppra by 1 tablet daily.  I advised him to stop tapering Keppra at this time and contact Dr. Almon Arms office.  Of note, the patient underwent transurethral resection of the prostate and bladder lesion on January 31. Pathology from this procedure was negative for malignancy.   PHYSICAL EXAM:  Blood pressure (!) 140/79, pulse (!) 56, temperature 97.6 F (36.4 C), temperature source Oral, resp. rate 18, height 5\' 8"  (1.727 m), weight 126 lb 14.4 oz (57.6 kg), SpO2 100%. Wt Readings from Last 3 Encounters:  07/31/23 126 lb 14.4 oz (57.6 kg)  05/03/23 131 lb 8 oz (59.6 kg)  04/04/23 129 lb (58.5 kg)   Body mass index is 19.3 kg/m.  Performance status (ECOG): 2 - Symptomatic, <50% confined to bed  Physical Exam Vitals and nursing note reviewed.  Constitutional:      General: He is not in acute distress.    Appearance: Normal appearance. He is normal weight. He is ill-appearing (Chronically ill-appearing).  HENT:     Head: Normocephalic and atraumatic.     Mouth/Throat:     Mouth: Mucous membranes are moist.      Pharynx: Oropharynx is clear. No oropharyngeal exudate or posterior oropharyngeal erythema.  Eyes:     General: No scleral icterus.    Extraocular Movements: Extraocular movements intact.     Conjunctiva/sclera: Conjunctivae normal.     Pupils: Pupils are equal, round, and reactive to light.  Cardiovascular:     Rate and Rhythm: Normal rate and regular rhythm.     Heart sounds: Normal heart sounds. No murmur heard.    No friction rub. No gallop.  Pulmonary:     Effort: Pulmonary effort is normal.     Breath sounds: Normal breath sounds. No wheezing, rhonchi or rales.  Abdominal:     General: Bowel sounds are normal. There is no distension.     Palpations: Abdomen is soft. There is no hepatomegaly, splenomegaly or mass.     Tenderness: There is no abdominal tenderness.  Musculoskeletal:        General: Normal range of motion.     Cervical back: Normal range of motion and neck supple. No tenderness.     Right lower leg: No edema.     Left lower leg: No edema.  Lymphadenopathy:     Cervical: No cervical adenopathy.     Upper Body:     Right upper body: No supraclavicular or axillary adenopathy.     Left upper body: No supraclavicular or axillary adenopathy.     Lower Body: No right inguinal  adenopathy. No left inguinal adenopathy.  Skin:    General: Skin is warm and dry.     Coloration: Skin is not jaundiced.     Findings: No rash.  Neurological:     Mental Status: He is alert and oriented to person, place, and time.     Cranial Nerves: No cranial nerve deficit.     Gait: Gait abnormal (Walks with cane).  Psychiatric:        Mood and Affect: Mood normal.        Behavior: Behavior normal.        Thought Content: Thought content normal.     LABS:      Latest Ref Rng & Units 07/31/2023    2:05 PM 05/03/2023    2:23 PM 04/04/2023    2:47 PM  CBC  WBC 4.0 - 10.5 K/uL 5.2  5.9  5.7   Hemoglobin 13.0 - 17.0 g/dL 16.1  09.6  04.5   Hematocrit 39.0 - 52.0 % 34.0  31.2  30.7    Platelets 150 - 400 K/uL 133  156  147     Latest Reference Range & Units 07/31/23 14:05  Neutrophils % 55  Lymphocytes % 33  Monocytes Relative % 7  Eosinophil % 4  Basophil % 1  Immature Granulocytes % 0  NEUT# 1.7 - 7.7 K/uL 2.8  Lymphs Abs 0.7 - 4.0 K/uL 1.7  Monocyte # 0.1 - 1.0 K/uL 0.4  Eosinophils Absolute 0.0 - 0.5 K/uL 0.2  Basophils Absolute 0.0 - 0.1 K/uL 0.0  Abs Immature Granulocytes 0.00 - 0.07 K/uL 0.01      Latest Ref Rng & Units 07/31/2023    2:05 PM 05/03/2023    2:23 PM 04/04/2023    2:47 PM  CMP  Glucose 70 - 99 mg/dL 89  409  811   BUN 8 - 23 mg/dL 31  33  27   Creatinine 0.61 - 1.24 mg/dL 9.14  7.82  9.56   Sodium 135 - 145 mmol/L 142  143  142   Potassium 3.5 - 5.1 mmol/L 4.4  4.6  4.5   Chloride 98 - 111 mmol/L 108  104  106   CO2 22 - 32 mmol/L 20  26  24    Calcium 8.9 - 10.3 mg/dL 9.9  9.6  9.6   Total Protein 6.5 - 8.1 g/dL 7.2  6.7  6.9   Total Bilirubin 0.0 - 1.2 mg/dL 0.4  <2.1  0.3   Alkaline Phos 38 - 126 U/L 103  118  138   AST 15 - 41 U/L 38  27  24   ALT 0 - 44 U/L 55  28  19     Lab Results  Component Value Date   TOTALPROTELP 6.9 04/04/2022   Lab Results  Component Value Date   TIBC 265 05/03/2023   TIBC 218 (L) 04/04/2023   FERRITIN 369 (H) 05/03/2023   FERRITIN 359 (H) 04/04/2023   FERRITIN 157 01/29/2023   IRONPCTSAT 39 05/03/2023   IRONPCTSAT 35 04/04/2023   No results found for: "LDH"     Component Value Date/Time   TOTALPROTELP 6.9 04/04/2022 1517   IGGSERUM 1,159 04/04/2022 1517   IGMSERUM 195 (H) 04/04/2022 1517    Review Flowsheet  More data exists      Latest Ref Rng & Units 01/29/2023 04/04/2023 05/03/2023  Oncology Labs  Ferritin 24 - 336 ng/mL 157  359  369   %SAT 17.9 - 39.5 % -  35  39      STUDIES:  No results found.    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  He has not required epoetin  injections since August 2024 and his hemoglobin is  better at this time.  Therefore, I will plan to see him back in 6 months for repeat clinical assessment.  The patient and his son understand all the plans discussed today and are in agreement with them.  They know to contact our office if he develops concerns prior to his next appointment.     Alfonso Ike, PA-C   Physician Assistant [facility] [place] [phone]

## 2023-07-31 ENCOUNTER — Telehealth: Payer: Self-pay | Admitting: Hematology and Oncology

## 2023-07-31 ENCOUNTER — Inpatient Hospital Stay: Payer: Medicare Other | Attending: Hematology and Oncology | Admitting: Hematology and Oncology

## 2023-07-31 ENCOUNTER — Inpatient Hospital Stay: Payer: Medicare Other

## 2023-07-31 ENCOUNTER — Encounter: Payer: Self-pay | Admitting: Hematology and Oncology

## 2023-07-31 VITALS — BP 140/79 | HR 56 | Temp 97.6°F | Resp 18 | Ht 68.0 in | Wt 126.9 lb

## 2023-07-31 DIAGNOSIS — N1832 Chronic kidney disease, stage 3b: Secondary | ICD-10-CM

## 2023-07-31 DIAGNOSIS — D509 Iron deficiency anemia, unspecified: Secondary | ICD-10-CM | POA: Diagnosis not present

## 2023-07-31 DIAGNOSIS — N189 Chronic kidney disease, unspecified: Secondary | ICD-10-CM | POA: Diagnosis not present

## 2023-07-31 DIAGNOSIS — E538 Deficiency of other specified B group vitamins: Secondary | ICD-10-CM | POA: Insufficient documentation

## 2023-07-31 DIAGNOSIS — D631 Anemia in chronic kidney disease: Secondary | ICD-10-CM | POA: Diagnosis not present

## 2023-07-31 LAB — CBC WITH DIFFERENTIAL (CANCER CENTER ONLY)
Abs Immature Granulocytes: 0.01 10*3/uL (ref 0.00–0.07)
Basophils Absolute: 0 10*3/uL (ref 0.0–0.1)
Basophils Relative: 1 %
Eosinophils Absolute: 0.2 10*3/uL (ref 0.0–0.5)
Eosinophils Relative: 4 %
HCT: 34 % — ABNORMAL LOW (ref 39.0–52.0)
Hemoglobin: 11.3 g/dL — ABNORMAL LOW (ref 13.0–17.0)
Immature Granulocytes: 0 %
Lymphocytes Relative: 33 %
Lymphs Abs: 1.7 10*3/uL (ref 0.7–4.0)
MCH: 30.5 pg (ref 26.0–34.0)
MCHC: 33.2 g/dL (ref 30.0–36.0)
MCV: 91.6 fL (ref 80.0–100.0)
Monocytes Absolute: 0.4 10*3/uL (ref 0.1–1.0)
Monocytes Relative: 7 %
Neutro Abs: 2.8 10*3/uL (ref 1.7–7.7)
Neutrophils Relative %: 55 %
Platelet Count: 133 10*3/uL — ABNORMAL LOW (ref 150–400)
RBC: 3.71 MIL/uL — ABNORMAL LOW (ref 4.22–5.81)
RDW: 12.5 % (ref 11.5–15.5)
WBC Count: 5.2 10*3/uL (ref 4.0–10.5)
nRBC: 0 % (ref 0.0–0.2)

## 2023-07-31 LAB — CMP (CANCER CENTER ONLY)
ALT: 55 U/L — ABNORMAL HIGH (ref 0–44)
AST: 38 U/L (ref 15–41)
Albumin: 4.2 g/dL (ref 3.5–5.0)
Alkaline Phosphatase: 103 U/L (ref 38–126)
Anion gap: 13 (ref 5–15)
BUN: 31 mg/dL — ABNORMAL HIGH (ref 8–23)
CO2: 20 mmol/L — ABNORMAL LOW (ref 22–32)
Calcium: 9.9 mg/dL (ref 8.9–10.3)
Chloride: 108 mmol/L (ref 98–111)
Creatinine: 1.8 mg/dL — ABNORMAL HIGH (ref 0.61–1.24)
GFR, Estimated: 37 mL/min — ABNORMAL LOW (ref >60)
Glucose, Bld: 89 mg/dL (ref 70–99)
Potassium: 4.4 mmol/L (ref 3.5–5.1)
Sodium: 142 mmol/L (ref 135–145)
Total Bilirubin: 0.4 mg/dL (ref 0.0–1.2)
Total Protein: 7.2 g/dL (ref 6.5–8.1)

## 2023-07-31 LAB — IRON AND TIBC
Iron: 114 ug/dL (ref 45–182)
Saturation Ratios: 47 % — ABNORMAL HIGH (ref 17.9–39.5)
TIBC: 245 ug/dL — ABNORMAL LOW (ref 250–450)
UIBC: 131 ug/dL

## 2023-07-31 LAB — VITAMIN B12: Vitamin B-12: 479 pg/mL (ref 180–914)

## 2023-07-31 LAB — FERRITIN: Ferritin: 350 ng/mL — ABNORMAL HIGH (ref 24–336)

## 2023-07-31 NOTE — Telephone Encounter (Signed)
 Patient has been scheduled for follow-up visit per 07/31/23 LOS.  Pt given an appt calendar with date and time.

## 2023-08-01 DIAGNOSIS — N401 Enlarged prostate with lower urinary tract symptoms: Secondary | ICD-10-CM | POA: Diagnosis not present

## 2023-08-01 DIAGNOSIS — N138 Other obstructive and reflux uropathy: Secondary | ICD-10-CM | POA: Diagnosis not present

## 2023-08-01 DIAGNOSIS — R339 Retention of urine, unspecified: Secondary | ICD-10-CM | POA: Diagnosis not present

## 2023-08-01 DIAGNOSIS — F5232 Male orgasmic disorder: Secondary | ICD-10-CM | POA: Diagnosis not present

## 2023-08-20 DIAGNOSIS — Z79899 Other long term (current) drug therapy: Secondary | ICD-10-CM | POA: Diagnosis not present

## 2023-10-09 DIAGNOSIS — R636 Underweight: Secondary | ICD-10-CM | POA: Diagnosis not present

## 2023-10-09 DIAGNOSIS — G43909 Migraine, unspecified, not intractable, without status migrainosus: Secondary | ICD-10-CM | POA: Diagnosis not present

## 2023-10-09 DIAGNOSIS — R569 Unspecified convulsions: Secondary | ICD-10-CM | POA: Diagnosis not present

## 2023-10-09 DIAGNOSIS — Z719 Counseling, unspecified: Secondary | ICD-10-CM | POA: Diagnosis not present

## 2023-10-09 DIAGNOSIS — M1990 Unspecified osteoarthritis, unspecified site: Secondary | ICD-10-CM | POA: Diagnosis not present

## 2023-10-28 DIAGNOSIS — R4701 Aphasia: Secondary | ICD-10-CM | POA: Diagnosis not present

## 2023-10-28 DIAGNOSIS — I612 Nontraumatic intracerebral hemorrhage in hemisphere, unspecified: Secondary | ICD-10-CM | POA: Diagnosis not present

## 2023-10-28 DIAGNOSIS — N1832 Chronic kidney disease, stage 3b: Secondary | ICD-10-CM | POA: Diagnosis not present

## 2023-10-28 DIAGNOSIS — R64 Cachexia: Secondary | ICD-10-CM | POA: Diagnosis not present

## 2023-10-28 DIAGNOSIS — R531 Weakness: Secondary | ICD-10-CM | POA: Diagnosis not present

## 2023-10-28 DIAGNOSIS — S27331A Laceration of lung, unilateral, initial encounter: Secondary | ICD-10-CM | POA: Diagnosis not present

## 2023-10-28 DIAGNOSIS — I69191 Dysphagia following nontraumatic intracerebral hemorrhage: Secondary | ICD-10-CM | POA: Diagnosis not present

## 2023-10-28 DIAGNOSIS — Z4659 Encounter for fitting and adjustment of other gastrointestinal appliance and device: Secondary | ICD-10-CM | POA: Diagnosis not present

## 2023-10-28 DIAGNOSIS — J986 Disorders of diaphragm: Secondary | ICD-10-CM | POA: Diagnosis not present

## 2023-10-28 DIAGNOSIS — R509 Fever, unspecified: Secondary | ICD-10-CM | POA: Diagnosis not present

## 2023-10-28 DIAGNOSIS — S8992XA Unspecified injury of left lower leg, initial encounter: Secondary | ICD-10-CM | POA: Diagnosis not present

## 2023-10-28 DIAGNOSIS — S062XAA Diffuse traumatic brain injury with loss of consciousness status unknown, initial encounter: Secondary | ICD-10-CM | POA: Diagnosis not present

## 2023-10-28 DIAGNOSIS — S270XXA Traumatic pneumothorax, initial encounter: Secondary | ICD-10-CM | POA: Diagnosis not present

## 2023-10-28 DIAGNOSIS — I629 Nontraumatic intracranial hemorrhage, unspecified: Secondary | ICD-10-CM | POA: Diagnosis not present

## 2023-10-28 DIAGNOSIS — T17398A Other foreign object in larynx causing other injury, initial encounter: Secondary | ICD-10-CM | POA: Diagnosis not present

## 2023-10-28 DIAGNOSIS — S0635AA Traumatic hemorrhage of left cerebrum with loss of consciousness status unknown, initial encounter: Secondary | ICD-10-CM | POA: Diagnosis not present

## 2023-10-28 DIAGNOSIS — S72142D Displaced intertrochanteric fracture of left femur, subsequent encounter for closed fracture with routine healing: Secondary | ICD-10-CM | POA: Diagnosis not present

## 2023-10-28 DIAGNOSIS — S72022A Displaced fracture of epiphysis (separation) (upper) of left femur, initial encounter for closed fracture: Secondary | ICD-10-CM | POA: Diagnosis not present

## 2023-10-28 DIAGNOSIS — S72142A Displaced intertrochanteric fracture of left femur, initial encounter for closed fracture: Secondary | ICD-10-CM | POA: Diagnosis not present

## 2023-10-28 DIAGNOSIS — S06360A Traumatic hemorrhage of cerebrum, unspecified, without loss of consciousness, initial encounter: Secondary | ICD-10-CM | POA: Diagnosis not present

## 2023-10-28 DIAGNOSIS — G4700 Insomnia, unspecified: Secondary | ICD-10-CM | POA: Diagnosis not present

## 2023-10-28 DIAGNOSIS — G8911 Acute pain due to trauma: Secondary | ICD-10-CM | POA: Diagnosis not present

## 2023-10-28 DIAGNOSIS — E039 Hypothyroidism, unspecified: Secondary | ICD-10-CM | POA: Diagnosis not present

## 2023-10-28 DIAGNOSIS — M25562 Pain in left knee: Secondary | ICD-10-CM | POA: Diagnosis not present

## 2023-10-28 DIAGNOSIS — D631 Anemia in chronic kidney disease: Secondary | ICD-10-CM | POA: Diagnosis not present

## 2023-10-28 DIAGNOSIS — Z4682 Encounter for fitting and adjustment of non-vascular catheter: Secondary | ICD-10-CM | POA: Diagnosis not present

## 2023-10-28 DIAGNOSIS — I618 Other nontraumatic intracerebral hemorrhage: Secondary | ICD-10-CM | POA: Diagnosis not present

## 2023-10-28 DIAGNOSIS — R569 Unspecified convulsions: Secondary | ICD-10-CM | POA: Diagnosis not present

## 2023-10-28 DIAGNOSIS — J984 Other disorders of lung: Secondary | ICD-10-CM | POA: Diagnosis not present

## 2023-10-28 DIAGNOSIS — Z79890 Hormone replacement therapy (postmenopausal): Secondary | ICD-10-CM | POA: Diagnosis not present

## 2023-10-28 DIAGNOSIS — Z7409 Other reduced mobility: Secondary | ICD-10-CM | POA: Diagnosis not present

## 2023-10-28 DIAGNOSIS — G8918 Other acute postprocedural pain: Secondary | ICD-10-CM | POA: Diagnosis not present

## 2023-10-28 DIAGNOSIS — G40909 Epilepsy, unspecified, not intractable, without status epilepticus: Secondary | ICD-10-CM | POA: Diagnosis not present

## 2023-10-28 DIAGNOSIS — R001 Bradycardia, unspecified: Secondary | ICD-10-CM | POA: Diagnosis not present

## 2023-10-28 DIAGNOSIS — G935 Compression of brain: Secondary | ICD-10-CM | POA: Diagnosis not present

## 2023-10-28 DIAGNOSIS — R54 Age-related physical debility: Secondary | ICD-10-CM | POA: Diagnosis not present

## 2023-10-28 DIAGNOSIS — Z7189 Other specified counseling: Secondary | ICD-10-CM | POA: Diagnosis not present

## 2023-10-28 DIAGNOSIS — R1310 Dysphagia, unspecified: Secondary | ICD-10-CM | POA: Diagnosis not present

## 2023-10-28 DIAGNOSIS — S7292XA Unspecified fracture of left femur, initial encounter for closed fracture: Secondary | ICD-10-CM | POA: Diagnosis not present

## 2023-10-28 DIAGNOSIS — S199XXA Unspecified injury of neck, initial encounter: Secondary | ICD-10-CM | POA: Diagnosis not present

## 2023-10-28 DIAGNOSIS — Z87891 Personal history of nicotine dependence: Secondary | ICD-10-CM | POA: Diagnosis not present

## 2023-10-28 DIAGNOSIS — I129 Hypertensive chronic kidney disease with stage 1 through stage 4 chronic kidney disease, or unspecified chronic kidney disease: Secondary | ICD-10-CM | POA: Diagnosis not present

## 2023-10-28 DIAGNOSIS — N401 Enlarged prostate with lower urinary tract symptoms: Secondary | ICD-10-CM | POA: Diagnosis not present

## 2023-10-28 DIAGNOSIS — Z0189 Encounter for other specified special examinations: Secondary | ICD-10-CM | POA: Diagnosis not present

## 2023-10-28 DIAGNOSIS — I2510 Atherosclerotic heart disease of native coronary artery without angina pectoris: Secondary | ICD-10-CM | POA: Diagnosis not present

## 2023-10-28 DIAGNOSIS — R918 Other nonspecific abnormal finding of lung field: Secondary | ICD-10-CM | POA: Diagnosis not present

## 2023-10-28 DIAGNOSIS — J939 Pneumothorax, unspecified: Secondary | ICD-10-CM | POA: Diagnosis not present

## 2023-10-28 DIAGNOSIS — F5101 Primary insomnia: Secondary | ICD-10-CM | POA: Diagnosis not present

## 2023-10-28 DIAGNOSIS — F05 Delirium due to known physiological condition: Secondary | ICD-10-CM | POA: Diagnosis not present

## 2023-10-28 DIAGNOSIS — I619 Nontraumatic intracerebral hemorrhage, unspecified: Secondary | ICD-10-CM | POA: Diagnosis not present

## 2023-10-28 DIAGNOSIS — R1314 Dysphagia, pharyngoesophageal phase: Secondary | ICD-10-CM | POA: Diagnosis not present

## 2023-10-28 DIAGNOSIS — S069XAA Unspecified intracranial injury with loss of consciousness status unknown, initial encounter: Secondary | ICD-10-CM | POA: Diagnosis not present

## 2023-10-28 DIAGNOSIS — R1312 Dysphagia, oropharyngeal phase: Secondary | ICD-10-CM | POA: Diagnosis not present

## 2023-10-28 DIAGNOSIS — Z9181 History of falling: Secondary | ICD-10-CM | POA: Diagnosis not present

## 2023-10-28 DIAGNOSIS — W1830XA Fall on same level, unspecified, initial encounter: Secondary | ICD-10-CM | POA: Diagnosis not present

## 2023-10-28 DIAGNOSIS — R4189 Other symptoms and signs involving cognitive functions and awareness: Secondary | ICD-10-CM | POA: Diagnosis not present

## 2023-10-28 DIAGNOSIS — S066XAA Traumatic subarachnoid hemorrhage with loss of consciousness status unknown, initial encounter: Secondary | ICD-10-CM | POA: Diagnosis not present

## 2023-10-28 DIAGNOSIS — S062X0D Diffuse traumatic brain injury without loss of consciousness, subsequent encounter: Secondary | ICD-10-CM | POA: Diagnosis not present

## 2023-10-28 DIAGNOSIS — Z87898 Personal history of other specified conditions: Secondary | ICD-10-CM | POA: Diagnosis not present

## 2023-10-28 DIAGNOSIS — R404 Transient alteration of awareness: Secondary | ICD-10-CM | POA: Diagnosis not present

## 2023-10-28 DIAGNOSIS — Z79899 Other long term (current) drug therapy: Secondary | ICD-10-CM | POA: Diagnosis not present

## 2023-10-28 DIAGNOSIS — T17300A Unspecified foreign body in larynx causing asphyxiation, initial encounter: Secondary | ICD-10-CM | POA: Diagnosis not present

## 2023-10-28 DIAGNOSIS — R410 Disorientation, unspecified: Secondary | ICD-10-CM | POA: Diagnosis not present

## 2023-10-28 DIAGNOSIS — I440 Atrioventricular block, first degree: Secondary | ICD-10-CM | POA: Diagnosis not present

## 2023-10-28 DIAGNOSIS — Q282 Arteriovenous malformation of cerebral vessels: Secondary | ICD-10-CM | POA: Diagnosis not present

## 2023-10-28 DIAGNOSIS — I471 Supraventricular tachycardia, unspecified: Secondary | ICD-10-CM | POA: Diagnosis not present

## 2023-10-28 DIAGNOSIS — S72145A Nondisplaced intertrochanteric fracture of left femur, initial encounter for closed fracture: Secondary | ICD-10-CM | POA: Diagnosis not present

## 2023-10-28 DIAGNOSIS — S72025A Nondisplaced fracture of epiphysis (separation) (upper) of left femur, initial encounter for closed fracture: Secondary | ICD-10-CM | POA: Diagnosis not present

## 2023-10-28 DIAGNOSIS — Z789 Other specified health status: Secondary | ICD-10-CM | POA: Diagnosis not present

## 2023-10-28 DIAGNOSIS — R40242 Glasgow coma scale score 9-12, unspecified time: Secondary | ICD-10-CM | POA: Diagnosis not present

## 2023-10-28 DIAGNOSIS — T1490XA Injury, unspecified, initial encounter: Secondary | ICD-10-CM | POA: Diagnosis not present

## 2023-10-28 DIAGNOSIS — R4182 Altered mental status, unspecified: Secondary | ICD-10-CM | POA: Diagnosis not present

## 2023-10-28 DIAGNOSIS — R52 Pain, unspecified: Secondary | ICD-10-CM | POA: Diagnosis not present

## 2023-10-28 DIAGNOSIS — I444 Left anterior fascicular block: Secondary | ICD-10-CM | POA: Diagnosis not present

## 2023-10-28 DIAGNOSIS — S2241XA Multiple fractures of ribs, right side, initial encounter for closed fracture: Secondary | ICD-10-CM | POA: Diagnosis not present

## 2023-10-28 DIAGNOSIS — I4891 Unspecified atrial fibrillation: Secondary | ICD-10-CM | POA: Diagnosis not present

## 2023-11-15 DIAGNOSIS — E7800 Pure hypercholesterolemia, unspecified: Secondary | ICD-10-CM | POA: Diagnosis not present

## 2023-11-15 DIAGNOSIS — Q282 Arteriovenous malformation of cerebral vessels: Secondary | ICD-10-CM | POA: Diagnosis not present

## 2023-11-15 DIAGNOSIS — S0635AD Traumatic hemorrhage of left cerebrum with loss of consciousness status unknown, subsequent encounter: Secondary | ICD-10-CM | POA: Diagnosis not present

## 2023-11-15 DIAGNOSIS — Z79899 Other long term (current) drug therapy: Secondary | ICD-10-CM | POA: Diagnosis not present

## 2023-11-15 DIAGNOSIS — F419 Anxiety disorder, unspecified: Secondary | ICD-10-CM | POA: Diagnosis not present

## 2023-11-15 DIAGNOSIS — Z9089 Acquired absence of other organs: Secondary | ICD-10-CM | POA: Diagnosis not present

## 2023-11-15 DIAGNOSIS — R52 Pain, unspecified: Secondary | ICD-10-CM | POA: Diagnosis not present

## 2023-11-15 DIAGNOSIS — Z87891 Personal history of nicotine dependence: Secondary | ICD-10-CM | POA: Diagnosis not present

## 2023-11-15 DIAGNOSIS — I2510 Atherosclerotic heart disease of native coronary artery without angina pectoris: Secondary | ICD-10-CM | POA: Diagnosis not present

## 2023-11-15 DIAGNOSIS — S72002D Fracture of unspecified part of neck of left femur, subsequent encounter for closed fracture with routine healing: Secondary | ICD-10-CM | POA: Diagnosis not present

## 2023-11-15 DIAGNOSIS — Z743 Need for continuous supervision: Secondary | ICD-10-CM | POA: Diagnosis not present

## 2023-11-15 DIAGNOSIS — Z87898 Personal history of other specified conditions: Secondary | ICD-10-CM | POA: Diagnosis not present

## 2023-11-15 DIAGNOSIS — I619 Nontraumatic intracerebral hemorrhage, unspecified: Secondary | ICD-10-CM | POA: Diagnosis not present

## 2023-11-15 DIAGNOSIS — N1832 Chronic kidney disease, stage 3b: Secondary | ICD-10-CM | POA: Diagnosis not present

## 2023-11-15 DIAGNOSIS — I612 Nontraumatic intracerebral hemorrhage in hemisphere, unspecified: Secondary | ICD-10-CM | POA: Diagnosis not present

## 2023-11-15 DIAGNOSIS — Z886 Allergy status to analgesic agent status: Secondary | ICD-10-CM | POA: Diagnosis not present

## 2023-11-15 DIAGNOSIS — Z8673 Personal history of transient ischemic attack (TIA), and cerebral infarction without residual deficits: Secondary | ICD-10-CM | POA: Diagnosis not present

## 2023-11-15 DIAGNOSIS — S72022A Displaced fracture of epiphysis (separation) (upper) of left femur, initial encounter for closed fracture: Secondary | ICD-10-CM | POA: Diagnosis not present

## 2023-11-15 DIAGNOSIS — R1314 Dysphagia, pharyngoesophageal phase: Secondary | ICD-10-CM | POA: Diagnosis not present

## 2023-11-15 DIAGNOSIS — R410 Disorientation, unspecified: Secondary | ICD-10-CM | POA: Diagnosis not present

## 2023-11-15 DIAGNOSIS — N400 Enlarged prostate without lower urinary tract symptoms: Secondary | ICD-10-CM | POA: Diagnosis not present

## 2023-11-15 DIAGNOSIS — R0609 Other forms of dyspnea: Secondary | ICD-10-CM | POA: Diagnosis not present

## 2023-11-15 DIAGNOSIS — I129 Hypertensive chronic kidney disease with stage 1 through stage 4 chronic kidney disease, or unspecified chronic kidney disease: Secondary | ICD-10-CM | POA: Diagnosis not present

## 2023-11-15 DIAGNOSIS — R1310 Dysphagia, unspecified: Secondary | ICD-10-CM | POA: Diagnosis not present

## 2023-11-15 DIAGNOSIS — E039 Hypothyroidism, unspecified: Secondary | ICD-10-CM | POA: Diagnosis not present

## 2023-11-15 DIAGNOSIS — G40909 Epilepsy, unspecified, not intractable, without status epilepticus: Secondary | ICD-10-CM | POA: Diagnosis not present

## 2023-11-15 DIAGNOSIS — I700 Atherosclerosis of aorta: Secondary | ICD-10-CM | POA: Diagnosis not present

## 2023-11-15 DIAGNOSIS — R338 Other retention of urine: Secondary | ICD-10-CM | POA: Diagnosis not present

## 2023-11-15 DIAGNOSIS — D631 Anemia in chronic kidney disease: Secondary | ICD-10-CM | POA: Diagnosis not present

## 2023-11-16 DIAGNOSIS — I619 Nontraumatic intracerebral hemorrhage, unspecified: Secondary | ICD-10-CM | POA: Diagnosis not present

## 2023-11-16 DIAGNOSIS — I612 Nontraumatic intracerebral hemorrhage in hemisphere, unspecified: Secondary | ICD-10-CM | POA: Diagnosis not present

## 2023-11-19 DIAGNOSIS — I619 Nontraumatic intracerebral hemorrhage, unspecified: Secondary | ICD-10-CM | POA: Diagnosis not present

## 2023-11-19 DIAGNOSIS — I612 Nontraumatic intracerebral hemorrhage in hemisphere, unspecified: Secondary | ICD-10-CM | POA: Diagnosis not present

## 2023-11-20 DIAGNOSIS — I619 Nontraumatic intracerebral hemorrhage, unspecified: Secondary | ICD-10-CM | POA: Diagnosis not present

## 2023-11-20 DIAGNOSIS — I612 Nontraumatic intracerebral hemorrhage in hemisphere, unspecified: Secondary | ICD-10-CM | POA: Diagnosis not present

## 2023-12-12 DIAGNOSIS — R519 Headache, unspecified: Secondary | ICD-10-CM | POA: Diagnosis not present

## 2023-12-12 DIAGNOSIS — Z6820 Body mass index (BMI) 20.0-20.9, adult: Secondary | ICD-10-CM | POA: Diagnosis not present

## 2023-12-12 DIAGNOSIS — D509 Iron deficiency anemia, unspecified: Secondary | ICD-10-CM | POA: Diagnosis not present

## 2023-12-12 DIAGNOSIS — M5030 Other cervical disc degeneration, unspecified cervical region: Secondary | ICD-10-CM | POA: Diagnosis not present

## 2023-12-12 DIAGNOSIS — N401 Enlarged prostate with lower urinary tract symptoms: Secondary | ICD-10-CM | POA: Diagnosis not present

## 2023-12-12 DIAGNOSIS — R531 Weakness: Secondary | ICD-10-CM | POA: Diagnosis not present

## 2023-12-14 DIAGNOSIS — S72142D Displaced intertrochanteric fracture of left femur, subsequent encounter for closed fracture with routine healing: Secondary | ICD-10-CM | POA: Diagnosis not present

## 2023-12-14 DIAGNOSIS — T148XXA Other injury of unspecified body region, initial encounter: Secondary | ICD-10-CM | POA: Diagnosis not present

## 2023-12-21 DIAGNOSIS — N1831 Chronic kidney disease, stage 3a: Secondary | ICD-10-CM | POA: Diagnosis not present

## 2024-01-09 DIAGNOSIS — Q2739 Arteriovenous malformation, other site: Secondary | ICD-10-CM | POA: Diagnosis not present

## 2024-01-09 DIAGNOSIS — I619 Nontraumatic intracerebral hemorrhage, unspecified: Secondary | ICD-10-CM | POA: Diagnosis not present

## 2024-01-09 DIAGNOSIS — S0630AA Unspecified focal traumatic brain injury with loss of consciousness status unknown, initial encounter: Secondary | ICD-10-CM | POA: Diagnosis not present

## 2024-01-09 DIAGNOSIS — I629 Nontraumatic intracranial hemorrhage, unspecified: Secondary | ICD-10-CM | POA: Diagnosis not present

## 2024-01-15 ENCOUNTER — Other Ambulatory Visit: Payer: Self-pay | Admitting: Hematology and Oncology

## 2024-01-15 DIAGNOSIS — N1832 Chronic kidney disease, stage 3b: Secondary | ICD-10-CM

## 2024-01-29 ENCOUNTER — Ambulatory Visit: Admitting: Hematology and Oncology

## 2024-01-29 ENCOUNTER — Inpatient Hospital Stay: Admitting: Hematology and Oncology

## 2024-01-29 ENCOUNTER — Other Ambulatory Visit

## 2024-01-29 ENCOUNTER — Inpatient Hospital Stay: Attending: Hematology and Oncology

## 2024-01-29 VITALS — BP 112/73 | HR 56 | Temp 97.9°F | Resp 16 | Wt 127.0 lb

## 2024-01-29 DIAGNOSIS — D509 Iron deficiency anemia, unspecified: Secondary | ICD-10-CM | POA: Insufficient documentation

## 2024-01-29 DIAGNOSIS — E538 Deficiency of other specified B group vitamins: Secondary | ICD-10-CM | POA: Diagnosis not present

## 2024-01-29 DIAGNOSIS — N1831 Chronic kidney disease, stage 3a: Secondary | ICD-10-CM | POA: Diagnosis not present

## 2024-01-29 DIAGNOSIS — N189 Chronic kidney disease, unspecified: Secondary | ICD-10-CM | POA: Insufficient documentation

## 2024-01-29 DIAGNOSIS — E611 Iron deficiency: Secondary | ICD-10-CM | POA: Diagnosis not present

## 2024-01-29 DIAGNOSIS — D631 Anemia in chronic kidney disease: Secondary | ICD-10-CM | POA: Diagnosis not present

## 2024-01-29 DIAGNOSIS — N1832 Chronic kidney disease, stage 3b: Secondary | ICD-10-CM

## 2024-01-29 LAB — CBC WITH DIFFERENTIAL (CANCER CENTER ONLY)
Abs Immature Granulocytes: 0.01 K/uL (ref 0.00–0.07)
Basophils Absolute: 0 K/uL (ref 0.0–0.1)
Basophils Relative: 1 %
Eosinophils Absolute: 0.1 K/uL (ref 0.0–0.5)
Eosinophils Relative: 2 %
HCT: 29.8 % — ABNORMAL LOW (ref 39.0–52.0)
Hemoglobin: 10 g/dL — ABNORMAL LOW (ref 13.0–17.0)
Immature Granulocytes: 0 %
Lymphocytes Relative: 31 %
Lymphs Abs: 1.6 K/uL (ref 0.7–4.0)
MCH: 31.2 pg (ref 26.0–34.0)
MCHC: 33.6 g/dL (ref 30.0–36.0)
MCV: 92.8 fL (ref 80.0–100.0)
Monocytes Absolute: 0.5 K/uL (ref 0.1–1.0)
Monocytes Relative: 10 %
Neutro Abs: 2.8 K/uL (ref 1.7–7.7)
Neutrophils Relative %: 56 %
Platelet Count: 138 K/uL — ABNORMAL LOW (ref 150–400)
RBC: 3.21 MIL/uL — ABNORMAL LOW (ref 4.22–5.81)
RDW: 12.3 % (ref 11.5–15.5)
WBC Count: 5 K/uL (ref 4.0–10.5)
nRBC: 0 % (ref 0.0–0.2)

## 2024-01-29 LAB — FERRITIN: Ferritin: 385 ng/mL — ABNORMAL HIGH (ref 24–336)

## 2024-01-29 LAB — CMP (CANCER CENTER ONLY)
ALT: 27 U/L (ref 0–44)
AST: 26 U/L (ref 15–41)
Albumin: 3.8 g/dL (ref 3.5–5.0)
Alkaline Phosphatase: 119 U/L (ref 38–126)
Anion gap: 8 (ref 5–15)
BUN: 22 mg/dL (ref 8–23)
CO2: 29 mmol/L (ref 22–32)
Calcium: 9.1 mg/dL (ref 8.9–10.3)
Chloride: 107 mmol/L (ref 98–111)
Creatinine: 1.58 mg/dL — ABNORMAL HIGH (ref 0.61–1.24)
GFR, Estimated: 43 mL/min — ABNORMAL LOW (ref >60)
Glucose, Bld: 96 mg/dL (ref 70–99)
Potassium: 3.6 mmol/L (ref 3.5–5.1)
Sodium: 144 mmol/L (ref 135–145)
Total Bilirubin: 0.4 mg/dL (ref 0.0–1.2)
Total Protein: 6.7 g/dL (ref 6.5–8.1)

## 2024-01-29 LAB — IRON AND TIBC
Iron: 84 ug/dL (ref 45–182)
Saturation Ratios: 39 % (ref 17.9–39.5)
TIBC: 216 ug/dL — ABNORMAL LOW (ref 250–450)
UIBC: 132 ug/dL

## 2024-01-29 LAB — VITAMIN B12: Vitamin B-12: 476 pg/mL (ref 180–914)

## 2024-01-29 LAB — FOLATE: Folate: 19.6 ng/mL (ref >5.9)

## 2024-01-29 NOTE — Progress Notes (Unsigned)
 [facility]  [address] [place],  [place]  [address] [phone]  Clinic Day:  01/29/2024  Referring physician: Jefferey Fitch, MD   HISTORY OF PRESENT ILLNESS:  The patient is a 83 y.o. 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 He had previously received IV iron at another center.  He receives epoetin  monthly as needed to keep his hemoglobin at or above 10. He received IV iron in the form of Feraheme again in December 2024 with slight improvement in his hemoglobin.   He is here today for repeat clinical assessment.  He denies progressive fatigue concerning for worsening anemia.  He denies any overt form of blood loss. Since his last visit, he collapsed and sustained a left femur fracture in August.  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 At the time he presented he was medically quite unwell, so neurosurgery recommended follow-up imaging and consideration of delayed treatment.  He was hospitalized for an extended period of time.  He underwent open reduction and internal fixation of the left femur in August.  He continues to follow up with neurosurgery and had CT angiogram head and neck earlier this month, this shows overall resolution of his hemorrhage.   He lives with his son. He is not back to baseline yet, but has made a great recovery. His primary continued issue is some issues with memory. He was having some mild memory issues prior to the hemorrhagic stroke, but now they are worse.   Of note, the patient underwent transurethral resection of the prostate and bladder lesion on January 31. Pathology from this procedure was negative for malignancy.   PHYSICAL EXAM:  Blood pressure 112/73, pulse (!) 56, temperature 97.9 F (36.6 C), temperature source Oral, resp. rate 16, weight 127 lb (57.6 kg), SpO2 100%. Wt Readings from Last 3 Encounters:   01/29/24 127 lb (57.6 kg)  07/31/23 126 lb 14.4 oz (57.6 kg)  05/03/23 131 lb 8 oz (59.6 kg)   Body mass index is 19.31 kg/m.  Performance status (ECOG): 2 - Symptomatic, <50% confined to bed  Physical Exam  LABS:      Latest Ref Rng & Units 01/29/2024   10:51 AM 07/31/2023    2:05 PM 05/03/2023    2:23 PM  CBC  WBC 4.0 - 10.5 K/uL 5.0  5.2  5.9   Hemoglobin 13.0 - 17.0 g/dL 89.9  88.6  89.4   Hematocrit 39.0 - 52.0 % 29.8  34.0  31.2   Platelets 150 - 400 K/uL 138  133  156        Latest Ref Rng & Units 01/29/2024   10:51 AM 07/31/2023    2:05 PM 05/03/2023    2:23 PM  CMP  Glucose 70 - 99 mg/dL 96  89  896   BUN 8 - 23 mg/dL 22  31  33   Creatinine 0.61 - 1.24 mg/dL 8.41  8.19  8.37   Sodium 135 - 145 mmol/L 144  142  143   Potassium 3.5 - 5.1 mmol/L 3.6  4.4  4.6   Chloride 98 - 111 mmol/L 107  108  104   CO2 22 - 32 mmol/L 29  20  26    Calcium 8.9 - 10.3 mg/dL 9.1  9.9  9.6   Total Protein 6.5 - 8.1 g/dL 6.7  7.2  6.7   Total Bilirubin 0.0 - 1.2 mg/dL 0.4  0.4  <9.7   Alkaline Phos 38 - 126 U/L 119  103  118   AST 15 - 41 U/L 26  38  27   ALT 0 - 44 U/L 27  55  28     Lab Results  Component Value Date   TOTALPROTELP 6.9 04/04/2022   Lab Results  Component Value Date   TIBC 245 (L) 07/31/2023   TIBC 265 05/03/2023   TIBC 218 (L) 04/04/2023   FERRITIN 350 (H) 07/31/2023   FERRITIN 369 (H) 05/03/2023   FERRITIN 359 (H) 04/04/2023   IRONPCTSAT 47 (H) 07/31/2023   IRONPCTSAT 39 05/03/2023   IRONPCTSAT 35 04/04/2023   No results found for: LDH     Component Value Date/Time   TOTALPROTELP 6.9 04/04/2022 1517   IGGSERUM 1,159 04/04/2022 1517   IGMSERUM 195 (H) 04/04/2022 1517    Review Flowsheet  More data exists      Latest Ref Rng & Units 04/04/2023 05/03/2023 07/31/2023  Oncology Labs  Ferritin 24 - 336 ng/mL 359  369  350   %SAT 17.9 - 39.5 % 35  39  47      STUDIES:  No results found.    ASSESSMENT & PLAN:   Assessment/Plan:  83  y.o. male with anemia due to chronic kidney disease.  He has not required epoetin  injections since August 2024 and his hemoglobin is better at this time.  Therefore, I will plan to see him back in 3 months for repeat clinical assessment.  The patient and his son understand all the plans discussed today and are in agreement with them.  They know to contact our office if he develops concerns prior to his next appointment.     Andrez DELENA Foy, PA-C   Physician Assistant [facility] [place] [phone]

## 2024-01-30 ENCOUNTER — Encounter: Payer: Self-pay | Admitting: Hematology and Oncology

## 2024-01-30 ENCOUNTER — Telehealth: Payer: Self-pay

## 2024-01-30 NOTE — Telephone Encounter (Signed)
 Left Steffan a detailed voicemail with his father's lab results.

## 2024-01-30 NOTE — Telephone Encounter (Signed)
-----   Message from Andrez DELENA Foy sent at 01/30/2024  9:48 AM EST ----- Please let his son know the vitamin levels were good, thanks

## 2024-02-15 DIAGNOSIS — T148XXA Other injury of unspecified body region, initial encounter: Secondary | ICD-10-CM | POA: Diagnosis not present

## 2024-03-24 ENCOUNTER — Encounter: Payer: Self-pay | Admitting: Hematology and Oncology

## 2024-04-23 ENCOUNTER — Inpatient Hospital Stay: Admitting: Hematology and Oncology

## 2024-04-23 ENCOUNTER — Inpatient Hospital Stay
